# Patient Record
Sex: Male | Born: 1975
Health system: Southern US, Community
[De-identification: ages and names within clinical notes are randomized; demographics above are authoritative.]

## PROBLEM LIST (undated history)

## (undated) DIAGNOSIS — I10 Essential (primary) hypertension: Secondary | ICD-10-CM

## (undated) DIAGNOSIS — Z860100 Personal history of colon polyps, unspecified: Secondary | ICD-10-CM

## (undated) DIAGNOSIS — R569 Unspecified convulsions: Secondary | ICD-10-CM

## (undated) DIAGNOSIS — E785 Hyperlipidemia, unspecified: Secondary | ICD-10-CM

## (undated) HISTORY — PX: TONSILLECTOMY: SUR1361

## (undated) HISTORY — DX: Essential (primary) hypertension: I10

## (undated) HISTORY — DX: Hyperlipidemia, unspecified: E78.5

## (undated) HISTORY — PX: COLONOSCOPY: SHX174

---

## 2008-07-31 ENCOUNTER — Emergency Department (HOSPITAL_COMMUNITY): Admission: EM | Admit: 2008-07-31 | Discharge: 2008-07-31 | Payer: Self-pay | Admitting: Emergency Medicine

## 2009-01-19 ENCOUNTER — Emergency Department (HOSPITAL_COMMUNITY): Admission: EM | Admit: 2009-01-19 | Discharge: 2009-01-19 | Payer: Self-pay | Admitting: Emergency Medicine

## 2011-07-18 LAB — POCT I-STAT, CHEM 8
BUN: 27 — ABNORMAL HIGH
Chloride: 105
Creatinine, Ser: 1.1
Glucose, Bld: 144 — ABNORMAL HIGH
HCT: 53 — ABNORMAL HIGH
Potassium: 5.3 — ABNORMAL HIGH

## 2012-10-16 HISTORY — PX: HERNIA REPAIR: SHX51

## 2014-08-14 ENCOUNTER — Telehealth (INDEPENDENT_AMBULATORY_CARE_PROVIDER_SITE_OTHER): Payer: Self-pay | Admitting: General Surgery

## 2014-08-14 NOTE — Telephone Encounter (Signed)
Patient called in explaining that he was seen last week and was prescribed  Lyrica 75MG , 1 (one) Capsule two times daily, #60, 07/29/2014, Ref. x1; however he went to pick up the Rx and it was $350 because his insurance does not cover this medication.  He would like to see if there is another one that Dr. Grandville Silos could prescribe that would be as effective but cheaper in price.  Informed him I would send this message to Dr. Grandville Silos and I would give him a call back once I have recieved a response.  Patient understood and agreed with this.

## 2014-08-14 NOTE — Telephone Encounter (Signed)
Rx called in to pharmacy and patient notified.  

## 2014-08-14 NOTE — Telephone Encounter (Signed)
Please call in neurontin 300mg : once on day one, BID on day two, then TID, #90 one refill

## 2014-09-17 ENCOUNTER — Encounter: Payer: Self-pay | Admitting: *Deleted

## 2014-09-20 NOTE — Progress Notes (Signed)
Patient ID: Lucas Gentry, male   DOB: 1975-11-04, 37 y.o.   MRN: [id number]    38 yo referred by primary Dr [name] or concern CAD. CRF;s elevated lipids and HTN and family history of premature CAD .  Has been having "hot flashes"  Reviewed recent labs and LDL 92 normal LFTs  "All of the men in my family have MI's or strokes in their 81's"  He is a Music therapist.  Sedentary.  Golf A bit.  Has episodes of diaphoresis when sitting.  Wife concerned about risk of CAD.  He has not had ETT before  Two children [name] and [name] 2/5 at [employer].  Knows Dr [name]    ROS: Denies fever, malais, weight loss, blurry vision, decreased visual acuity, cough, sputum, SOB, hemoptysis, pleuritic pain, palpitaitons, heartburn, abdominal pain, melena, lower extremity edema, claudication, or rash.  All other systems reviewed and negative   General: Affect appropriate Healthy:  appears stated age 52: normal Neck supple with no adenopathy JVP normal no bruits no thyromegaly Lungs clear with no wheezing and good diaphragmatic motion Heart:  S1/S2 no murmur,rub, gallop or click PMI normal Abdomen: benighn, BS positve, no tenderness, no AAA no bruit.  No HSM or HJR Distal pulses intact with no bruits No edema Neuro non-focal Skin warm and dry No muscular weakness  Medications Current Outpatient Prescriptions  Medication Sig Dispense Refill  . aspirin 81 MG tablet Take 81 mg by mouth daily.    Marland Kitchen atenolol (TENORMIN) 25 MG tablet Take 25 mg by mouth daily.    Marland Kitchen atorvastatin (LIPITOR) 10 MG tablet Take 10 mg by mouth daily.    Marland Kitchen lisinopril (PRINIVIL,ZESTRIL) 20 MG tablet Take 20 mg by mouth 2 (two) times daily.    . Multiple Vitamins-Minerals (MULTIVITAL PO) Take 1 tablet by mouth daily.    . Omega-3 Fatty Acids (FISH OIL PO) Take 1 capsule by mouth daily.     No current facility-administered medications for this visit.    Allergies Review of patient's allergies indicates no known  allergies.  Family History: Family History  Problem Relation Age of Onset  . Hypertension Mother   . Hyperlipidemia Mother   . Hyperlipidemia Father   . Hypertension Maternal Grandmother   . Heart attack Maternal Grandfather   . Hyperlipidemia Paternal Grandmother   . Cancer Paternal Grandfather   . Hyperlipidemia Paternal Grandfather   . Stroke Paternal Grandfather     Social History: History   Social History  . Marital Status: Married    Spouse Name: N/A    Number of Children: N/A  . Years of Education: N/A   Occupational History  . Not on file.   Social History Main Topics  . Smoking status: Never Smoker   . Smokeless tobacco: Not on file  . Alcohol Use: Yes     Comment: occasionaly  . Drug Use: No  . Sexual Activity: Not on file   Other Topics Concern  . Not on file   Social History Narrative  . No narrative on file    Past Surgical History  Procedure Laterality Date  . Hernia repair      Past Medical History  Diagnosis Date  . Hypertension   . Hyperlipidemia     Electrocardiogram:  SR rate 59 normal ECG  MD office 10/15  Today SR rate 72  Normal   Assessment and Plan

## 2014-09-21 ENCOUNTER — Encounter: Payer: Self-pay | Admitting: Cardiovascular Disease

## 2014-09-21 ENCOUNTER — Ambulatory Visit (INDEPENDENT_AMBULATORY_CARE_PROVIDER_SITE_OTHER)
Admission: RE | Admit: 2014-09-21 | Discharge: 2014-09-21 | Disposition: A | Discharge status: Home / Self Care | Payer: BC Managed Care – PPO | Source: Ambulatory Visit | Attending: Cardiovascular Disease | Admitting: Cardiovascular Disease

## 2014-09-21 ENCOUNTER — Ambulatory Visit (INDEPENDENT_AMBULATORY_CARE_PROVIDER_SITE_OTHER): Payer: BC Managed Care – PPO | Admitting: Cardiovascular Disease

## 2014-09-21 VITALS — BP 132/82 | HR 72 | Ht 72.0 in | Wt 256.1 lb

## 2014-09-21 DIAGNOSIS — E785 Hyperlipidemia, unspecified: Secondary | ICD-10-CM | POA: Insufficient documentation

## 2014-09-21 DIAGNOSIS — R079 Chest pain, unspecified: Secondary | ICD-10-CM

## 2014-09-21 DIAGNOSIS — Z8249 Family history of ischemic heart disease and other diseases of the circulatory system: Secondary | ICD-10-CM

## 2014-09-21 DIAGNOSIS — I10 Essential (primary) hypertension: Secondary | ICD-10-CM | POA: Insufficient documentation

## 2014-09-21 HISTORY — DX: Family history of ischemic heart disease and other diseases of the circulatory system: Z82.49

## 2014-09-21 NOTE — Assessment & Plan Note (Signed)
Will order ETT  Given normal ECG Also patient wants calcium score to risk stratify  Already on ASA and statin but intensity of Rx may change if score not 0

## 2014-09-21 NOTE — Assessment & Plan Note (Signed)
Cholesterol is at goal.  Continue current dose of statin and diet Rx.  No myalgias or side effects.  F/U  LFT's in 6 months. No results found for: LDLCALC           

## 2014-09-21 NOTE — Patient Instructions (Signed)
Your physician wants you to follow-up in:   [place] will receive a reminder letter in the mail two months in advance. If you don't receive a letter, please call our office to schedule the follow-up appointment. Your physician recommends that you continue on your current medications as directed. Please refer to the Current Medication list given to you today. Your physician has requested that you have an exercise tolerance test. For further information please visit [email or web address]. Please also follow instruction sheet, as given.   CALCIUM SCORE    OUT OF  POCKET  $150.00

## 2014-09-21 NOTE — Addendum Note (Signed)
Addended by: Gust Brooms A on: 09/21/2014 03:52 PM   Modules accepted: Orders

## 2014-09-21 NOTE — Assessment & Plan Note (Signed)
Well controlled.  Continue current medications and low sodium Dash type diet.    

## 2014-09-22 ENCOUNTER — Other Ambulatory Visit: Payer: Self-pay

## 2014-09-22 ENCOUNTER — Telehealth: Payer: Self-pay | Admitting: *Deleted

## 2014-09-22 DIAGNOSIS — R911 Solitary pulmonary nodule: Secondary | ICD-10-CM

## 2014-09-22 NOTE — Telephone Encounter (Signed)
    ADDENDUM REPORT: 09/22/2014 13:52  ADDENDUM: There is 9 mm pulmonary nodule at the left lung base on image 40, series 4. There are small calcified hilar lymph nodes on the right. No pericardial fluid. No mediastinal adenopathy identified. No axillary adenopathy.  Limited view of the upper abdomen is unremarkable.  IMPRESSION: 9 mm nodule at the left lung base. Recommend followup contrast CT of the thorax in 3 months for further evaluation (per Fleischner criteria).  These results will be called to the ordering clinician or representative by the Radiologist Assistant, and communication documented in the PACS or zVision Dashboard.   Electronically Signed  By: Suzy Bouchard M.D.   09/22/14-this report was called to Dr Johnsie Cancel by Vito Backers at [facility] [phone].  Dr Leonia Reeves wanted to make Dr Johnsie Cancel aware there is a 27mm nodule at the left lung base. Recommend followup contrast CT of the thorax in 3 months for further evaluation.  I will forward to Dr Nishan/Christine for review and send In Basket message to Dr Nishan/Christine.

## 2014-09-23 NOTE — Telephone Encounter (Signed)
I called him about his calcium score Radiologist saw lung nodule needs referral to pulmonary and repeat CT With contrast of chest in 3 months  Refer to pulmonary for f/u of lung nodule

## 2014-09-23 NOTE — Addendum Note (Signed)
Addended by: Devra Dopp E on: 09/23/2014 02:52 PM   Modules accepted: Orders

## 2014-09-23 NOTE — Telephone Encounter (Signed)
PT  AWARE  REFERRAL SENT  TO [facility]  TO  MAKE  AN APPT .Lucas Gentry

## 2014-09-24 ENCOUNTER — Telehealth: Payer: Self-pay | Admitting: Cardiovascular Disease

## 2014-09-24 ENCOUNTER — Other Ambulatory Visit: Payer: Self-pay | Admitting: Internal Medicine

## 2014-09-24 DIAGNOSIS — R911 Solitary pulmonary nodule: Secondary | ICD-10-CM

## 2014-09-24 NOTE — Telephone Encounter (Signed)
New Message        Pt calling stating that someone is suppose to be getting in contact with him for a CT. Please call back and advise.

## 2014-09-24 NOTE — Telephone Encounter (Signed)
PULMONARY NEEDS TO ORDER  CHEST  CT   FOR  NODULE  NEEDS  REPEATED  IN   3 MONTHS PER  RADIOLOGIST

## 2014-09-24 NOTE — Telephone Encounter (Signed)
Follow up    Patient at the office now.   Patient is asking for CT order to be fax [facility]  . Fax # [phone]. Can do CT to be done today.  Office [phone].

## 2014-09-25 ENCOUNTER — Other Ambulatory Visit: Payer: BC Managed Care – PPO

## 2014-09-25 ENCOUNTER — Ambulatory Visit (INDEPENDENT_AMBULATORY_CARE_PROVIDER_SITE_OTHER): Payer: BC Managed Care – PPO | Admitting: Internal Medicine

## 2014-09-25 ENCOUNTER — Ambulatory Visit (INDEPENDENT_AMBULATORY_CARE_PROVIDER_SITE_OTHER)
Admission: RE | Admit: 2014-09-25 | Discharge: 2014-09-25 | Disposition: A | Discharge status: Home / Self Care | Payer: BC Managed Care – PPO | Source: Ambulatory Visit | Attending: Internal Medicine | Admitting: Internal Medicine

## 2014-09-25 ENCOUNTER — Encounter: Payer: Self-pay | Admitting: Internal Medicine

## 2014-09-25 ENCOUNTER — Other Ambulatory Visit (INDEPENDENT_AMBULATORY_CARE_PROVIDER_SITE_OTHER): Payer: BC Managed Care – PPO

## 2014-09-25 VITALS — BP 128/90 | HR 118 | Ht 73.0 in | Wt 255.0 lb

## 2014-09-25 DIAGNOSIS — R05 Cough: Secondary | ICD-10-CM

## 2014-09-25 DIAGNOSIS — R059 Cough, unspecified: Secondary | ICD-10-CM

## 2014-09-25 DIAGNOSIS — I10 Essential (primary) hypertension: Secondary | ICD-10-CM

## 2014-09-25 DIAGNOSIS — R911 Solitary pulmonary nodule: Secondary | ICD-10-CM

## 2014-09-25 LAB — SEDIMENTATION RATE: SED RATE: 12 mm/h (ref 0–22)

## 2014-09-25 LAB — TSH: TSH: 0.89 u[IU]/mL (ref 0.35–4.50)

## 2014-09-25 MED ORDER — OLMESARTAN MEDOXOMIL 40 MG PO TABS
40.0000 mg | ORAL_TABLET | Freq: Every day | ORAL | Status: DC
Start: 2014-09-25 — End: 2014-09-25

## 2014-09-25 MED ORDER — OLMESARTAN MEDOXOMIL 40 MG PO TABS: 40.0000 mg | ORAL_TABLET | Freq: Every day | ORAL | Status: DC

## 2014-09-25 NOTE — Progress Notes (Signed)
   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 Pulmonary Consult    Referred by Dr. Johnsie Cancel for eval of pulmonary nodule.  Pt c/o cough since end of Oct 2015- worse when he lies down and non prod.   cough keeps wife up at night / dry but doesn't really bother pt   No obvious other patterns in day to day or daytime variabilty or assoc  cp or chest tightness, subjective wheeze overt sinus or hb symptoms. No unusual exp hx or h/o childhood pna/ asthma or knowledge of premature birth.  Sleeping ok without nocturnal  or early am exacerbation  of respiratory  c/o's or need for noct saba. Also denies any obvious fluctuation of symptoms with weather or environmental changes or other aggravating or alleviating factors except as outlined above   Current Medications, Allergies, Complete Past Medical History, Past Surgical History, Family History, and Social History were reviewed in [employer] record.           Review of Systems  Constitutional: Negative for fever, chills, activity change, appetite change and unexpected weight change.  HENT: Negative for congestion, dental problem, postnasal drip, rhinorrhea, sneezing, sore throat, trouble swallowing and voice change.   Eyes: Negative for visual disturbance.  Respiratory: Positive for cough. Negative for choking and shortness of breath.   Cardiovascular: Negative for chest pain and leg swelling.  Gastrointestinal: Negative for nausea, vomiting and abdominal pain.    Genitourinary: Negative for difficulty urinating.  Musculoskeletal: Negative for arthralgias.  Skin: Negative for rash.  Psychiatric/Behavioral: Negative for behavioral problems and confusion.       Objective:   Physical Exam  Stoic amb wm nad  Wt Readings from Last 3 Encounters:  09/25/14 255 lb (115.667 kg)  09/21/14 256 lb 1.9 oz (116.175 kg)    Vital signs reviewed   HEENT: nl dentition, turbinates, and orophanx. Nl external ear canals without cough reflex   NECK :  without JVD/Nodes/TM/ nl carotid upstrokes bilaterally   LUNGS: no acc muscle use, clear to A and P bilaterally without cough on insp or exp maneuvers   CV:  RRR  no s3 or murmur or increase in P2, no edema   ABD:  soft and nontender with nl excursion in the supine position. No bruits or organomegaly, bowel sounds nl  MS:  warm without deformities, calf tenderness, cyanosis or clubbing  SKIN: warm and dry without lesions    NEURO:  alert, approp, no deficits     CXR PA and Lateral:   09/25/2014 : 1. No acute cardiopulmonary disease. 2. Previously identified pulmonary nodule the left lung base is not well identified by standard chest x-ray.    Assessment & Plan:

## 2014-09-25 NOTE — Patient Instructions (Addendum)
Stop lisinopril - a trial off for 4 weeks is the only way to tell if it's the cause of any of your symptoms  Start benicar 40 mg daily and if blood pressure not well controlled ok to increase tenormin to goal pulse at rest of 60   I agree with a full non contrasted CT in 3 months and follow the   Fleischner society guidelines as rec by radiology.  Please remember to go to the lab and x-ray department downstairs for your tests - we will call you with the results when they are available.

## 2014-09-25 NOTE — Progress Notes (Signed)
Quick Note:  Spoke with pt and notified of results per Dr. Wert. Pt verbalized understanding and denied any questions.  ______ 

## 2014-09-27 DIAGNOSIS — R911 Solitary pulmonary nodule: Secondary | ICD-10-CM | POA: Insufficient documentation

## 2014-09-27 NOTE — Assessment & Plan Note (Signed)
The most common causes of chronic cough in immunocompetent adults include the following: upper airway cough syndrome (UACS), previously referred to as postnasal drip syndrome (PNDS), which is caused by variety of rhinosinus conditions; (2) asthma; (3) GERD; (4) chronic bronchitis from cigarette smoking or other inhaled environmental irritants; (5) nonasthmatic eosinophilic bronchitis; and (6) bronchiectasis.   These conditions, singly or in combination, have accounted for up to 94% of the causes of chronic cough in prospective studies.   Other conditions have constituted no >6% of the causes in prospective studies These have included bronchogenic carcinoma, chronic interstitial pneumonia, sarcoidosis, left ventricular failure, ACEI-induced cough, and aspiration from a condition associated with pharyngeal dysfunction.    Chronic cough is often simultaneously caused by more than one condition. A single cause has been found from 38 to 82% of the time, multiple causes from 18 to 62%. Multiply caused cough has been the result of three diseases up to 42% of the time.       Based on hx and exam, this is most likely:  Classic Upper airway cough syndrome, so named because it's frequently impossible to sort out how much is  CR/sinusitis with freq throat clearing (which can be related to primary GERD)   vs  causing  secondary (" extra esophageal")  GERD from wide swings in gastric pressure that occur with throat clearing, often  promoting self use of mint and menthol lozenges that reduce the lower esophageal sphincter tone and exacerbate the problem further in a cyclical fashion.   These are the same pts (now being labeled as having "irritable larynx syndrome" by some cough centers) who not infrequently have a history of having failed to tolerate ace inhibitors,  dry powder inhalers or biphosphonates or report having atypical reflux symptoms that don't respond to standard doses of PPI , and are easily confused as  having aecopd or asthma flares by even experienced allergists/ pulmonologists.   The first step is to stop ACEi (see hbp) and regroup if the cough persists.  See instructions for specific recommendations which were reviewed directly with the patient who was given a copy with highlighter outlining the key components.

## 2014-09-27 NOTE — Assessment & Plan Note (Addendum)

## 2014-09-27 NOTE — Assessment & Plan Note (Addendum)
Clearly this has nothing to do with with symptoms, all be they very non-specific  See Cardiac CT 09/22/14 9 mm pulmonary nodule at the left lung base > in computer for recall full CT of Chest 12/22/14   CT results reviewed with pt >>> Too small for PET or bx, not suspicious enough for excisional bx > really only option for now is follow the Fleischner society guidelines as rec by radiology = 3 month CT chest

## 2014-09-28 ENCOUNTER — Encounter: Payer: Self-pay | Admitting: Cardiovascular Disease

## 2014-10-07 ENCOUNTER — Telehealth (HOSPITAL_COMMUNITY): Payer: Self-pay

## 2014-10-07 NOTE — Telephone Encounter (Signed)
Encounter complete. 

## 2014-10-08 ENCOUNTER — Ambulatory Visit (HOSPITAL_COMMUNITY)
Admission: RE | Admit: 2014-10-08 | Discharge: 2014-10-08 | Disposition: A | Discharge status: Home / Self Care | Payer: BC Managed Care – PPO | Source: Ambulatory Visit | Attending: Cardiovascular Disease | Admitting: Cardiovascular Disease

## 2014-10-08 DIAGNOSIS — I10 Essential (primary) hypertension: Secondary | ICD-10-CM | POA: Insufficient documentation

## 2014-10-08 DIAGNOSIS — R079 Chest pain, unspecified: Secondary | ICD-10-CM

## 2014-10-08 NOTE — Procedures (Signed)
Exercise Treadmill Test     Test  Exercise Tolerance Test Ordering MD: [name], MD    Unique Test No: 1  Treadmill:  1  Indication for ETT: Strong FH MIs  Contraindication to ETT: No   Stress Modality: exercise - treadmill  Cardiac Imaging Performed: non   Protocol: standard Bruce - maximal  Max BP:  159/69  Max MPHR (bpm):  181 85% MPR (bpm):  154  MPHR obtained (bpm):  190 % MPHR obtained:  104  Reached 85% MPHR (min:sec):  7 Total Exercise Time (min-sec):  9  Workload in METS:  10.4 Borg Scale: 15  Reason ETT Terminated:  Fatigue, Leg Discomfort, exceeded MPHR    ST Segment Analysis At Rest: normal ST segments - no evidence of significant ST depression With Exercise: no evidence of significant ST depression  Other Information Arrhythmia:  No Angina during ETT:  absent (0) Quality of ETT:  diagnostic  ETT Interpretation:  normal - no evidence of ischemia by ST analysis  Comments: The patient had an good exercise tolerance.  There was no chest pain.  There was an appropriate level of dyspnea.  There were no arrhythmias, a normal heart rate response and normal BP response.  There were no ischemic ST T wave changes and a normal heart rate recovery.  The Duke Treadmill Score was 9 with low predicted risk.    Recommendations: No evidence of ischemia.  Further plans per Dr. Johnsie Cancel.

## 2014-12-03 ENCOUNTER — Other Ambulatory Visit: Payer: Self-pay | Admitting: Internal Medicine

## 2014-12-22 ENCOUNTER — Ambulatory Visit
Admission: RE | Admit: 2014-12-22 | Discharge: 2014-12-22 | Disposition: A | Discharge status: Home / Self Care | Payer: BC Managed Care – PPO | Source: Ambulatory Visit | Attending: Internal Medicine | Admitting: Internal Medicine

## 2014-12-22 ENCOUNTER — Telehealth: Payer: Self-pay | Admitting: Internal Medicine

## 2014-12-22 DIAGNOSIS — R911 Solitary pulmonary nodule: Secondary | ICD-10-CM

## 2014-12-22 MED ORDER — IOPAMIDOL (ISOVUE-300) INJECTION 61%
75.0000 mL | Freq: Once | INTRAVENOUS | Status: AC | PRN
  Administered 2014-12-22: 75 mL via INTRAVENOUS

## 2014-12-22 NOTE — Telephone Encounter (Signed)
Spoke with pt . Advised him that since we did not order the CT, we can release his results. Pt will call Dr. Glenna Durand office for his results. Nothing further was needed at this time.

## 2014-12-28 ENCOUNTER — Telehealth: Payer: Self-pay | Admitting: Internal Medicine

## 2014-12-28 NOTE — Telephone Encounter (Signed)
Ct Chest dated 12/22/14 was reviewed by Dr [name] He states that ct shows no changes, needs final scan done in 1 yr, placed in tickle file  Pt needs to come in sooner if needed if having any SOB/cough Spoke with pt and notified of results per Dr. [name]. Pt verbalized understanding and denied any questions.

## 2015-03-25 ENCOUNTER — Other Ambulatory Visit: Payer: Self-pay | Admitting: Internal Medicine

## 2015-03-25 DIAGNOSIS — R911 Solitary pulmonary nodule: Secondary | ICD-10-CM

## 2015-06-18 ENCOUNTER — Other Ambulatory Visit: Payer: Self-pay

## 2015-06-22 ENCOUNTER — Other Ambulatory Visit: Payer: Self-pay

## 2015-06-22 ENCOUNTER — Ambulatory Visit
Admission: RE | Admit: 2015-06-22 | Discharge: 2015-06-22 | Disposition: A | Discharge status: Home / Self Care | Payer: BLUE CROSS/BLUE SHIELD | Source: Ambulatory Visit | Attending: Internal Medicine | Admitting: Internal Medicine

## 2015-06-22 DIAGNOSIS — R911 Solitary pulmonary nodule: Secondary | ICD-10-CM

## 2015-10-19 NOTE — Progress Notes (Signed)
Patient ID: Lucas Gentry, male   DOB: [birth year], 40 y.o.   MRN: [id number]    40 y.o. +referred by primary Dr Lucas Gentry or concern CAD. CRF;s elevated lipids and HTN and family history of premature CAD .  Has been having "hot flashes"  Reviewed recent labs and LDL 92 normal LFTs  "All of the men in my family have MI's or strokes in their 65's"  He is a Music therapist.  Sedentary.  Golf A bit.  Has episodes of diaphoresis when sitting.  Wife concerned about risk of CAD.  He has not had ETT before  Two children Lucas Gentry and Lucas Gentry 2/5 at [employer].  Knows Dr Lucas Gentry   10/08/14  ETT normal with good exercise tolerance  09/21/14  Calcium score 0  However at 27mm LLL lung nodule that is being followed by pulmonary now  ROS: Denies fever, malais, weight loss, blurry vision, decreased visual acuity, cough, sputum, SOB, hemoptysis, pleuritic pain, palpitaitons, heartburn, abdominal pain, melena, lower extremity edema, claudication, or rash.  All other systems reviewed and negative   General: Affect appropriate Healthy:  appears stated age 2: normal Neck supple with no adenopathy JVP normal no bruits no thyromegaly Lungs clear with no wheezing and good diaphragmatic motion Heart:  S1/S2 no murmur,rub, gallop or click PMI normal Abdomen: benighn, BS positve, no tenderness, no AAA no bruit.  No HSM or HJR Distal pulses intact with no bruits No edema Neuro non-focal Skin warm and dry No muscular weakness  Medications Current Outpatient Prescriptions  Medication Sig Dispense Refill  . aspirin 81 MG tablet Take 81 mg by mouth daily.    Marland Kitchen atorvastatin (LIPITOR) 10 MG tablet Take 10 mg by mouth daily.    . Multiple Vitamins-Minerals (MULTIVITAL PO) Take 1 tablet by mouth daily.    Marland Kitchen olmesartan (BENICAR) 40 MG tablet Take 1 tablet (40 mg total) by mouth daily. 7 tablet 0  . Omega-3 Fatty Acids (FISH OIL PO) Take 1 capsule by mouth daily.    . valsartan (DIOVAN) 160 MG tablet Take 160  mg by mouth daily.  0  . VIRTUSSIN A/C 100-10 MG/5ML syrup   0   No current facility-administered medications for this visit.    Allergies Review of patient's allergies indicates no known allergies.  Family History: Family History  Problem Relation Age of Onset  . Hypertension Mother   . Hyperlipidemia Mother   . Hyperlipidemia Father   . Hypertension Maternal Grandmother   . Heart attack Maternal Grandfather   . Hyperlipidemia Paternal Grandmother   . Cancer Paternal Grandmother 16    ? type  . Hyperlipidemia Paternal Grandfather   . Stroke Paternal Grandfather     Social History: Social History   Social History  . Marital Status: Married    Spouse Name: N/A  . Number of Children: N/A  . Years of Education: N/A   Occupational History  . Financial advisor     Social History Main Topics  . Smoking status: Never Smoker   . Smokeless tobacco: Never Used  . Alcohol Use: 0.0 oz/week    0 Standard drinks or equivalent per week     Comment: occasionaly  . Drug Use: No  . Sexual Activity: Not on file   Other Topics Concern  . Not on file   Social History Narrative    Past Surgical History  Procedure Laterality Date  . Hernia repair      Past Medical History  Diagnosis Date  .  Hypertension   . Hyperlipidemia     Electrocardiogram:  SR rate 59 normal ECG  MD office 10/15  09/2014 SR rate 72  Normal  10/20/15  SR rate 75 normal   Assessment and Plan  HTN: Well controlled.  Continue current medications and low sodium Dash type diet.   Elevated Lipids Cholesterol is at goal.  Continue current dose of statin and diet Rx.  No myalgias or side effects.  F/U  LFT's in 6 months. No results found for: [facility]  Labs with primary           Family History of CAD  Calcium score 0 normal ETT normal ECG   Lucas Gentry

## 2015-10-20 ENCOUNTER — Encounter: Payer: Self-pay | Admitting: Cardiovascular Disease

## 2015-10-20 ENCOUNTER — Ambulatory Visit (INDEPENDENT_AMBULATORY_CARE_PROVIDER_SITE_OTHER): Payer: BLUE CROSS/BLUE SHIELD | Admitting: Cardiovascular Disease

## 2015-10-20 VITALS — BP 110/70 | HR 77 | Ht 73.0 in | Wt 264.0 lb

## 2015-10-20 DIAGNOSIS — Z8249 Family history of ischemic heart disease and other diseases of the circulatory system: Secondary | ICD-10-CM | POA: Diagnosis not present

## 2015-10-20 NOTE — Patient Instructions (Addendum)
Medication Instructions:  Your physician recommends that you continue on your current medications as directed. Please refer to the Current Medication list given to you today.  Labwork: NONE  Testing/Procedures: NONE  Follow-Up: Your physician wants you to follow-up in:1 year with Dr. Nishan. You will receive a reminder letter in the mail two months in advance. If you don't receive a letter, please call our office to schedule the follow-up appointment.   If you need a refill on your cardiac medications before your next appointment, please call your pharmacy.    

## 2015-10-21 ENCOUNTER — Other Ambulatory Visit: Payer: Self-pay | Admitting: Internal Medicine

## 2015-10-21 DIAGNOSIS — R2241 Localized swelling, mass and lump, right lower limb: Secondary | ICD-10-CM

## 2015-10-29 ENCOUNTER — Ambulatory Visit
Admission: RE | Admit: 2015-10-29 | Discharge: 2015-10-29 | Disposition: A | Discharge status: Home / Self Care | Payer: BLUE CROSS/BLUE SHIELD | Source: Ambulatory Visit | Attending: Internal Medicine | Admitting: Internal Medicine

## 2015-10-29 DIAGNOSIS — R2241 Localized swelling, mass and lump, right lower limb: Secondary | ICD-10-CM

## 2015-10-29 MED ORDER — GADOBENATE DIMEGLUMINE 529 MG/ML IV SOLN
20.0000 mL | Freq: Once | INTRAVENOUS | Status: AC | PRN
  Administered 2015-10-29: 20 mL via INTRAVENOUS

## 2015-11-24 ENCOUNTER — Ambulatory Visit: Payer: Self-pay | Admitting: General Surgery

## 2015-12-08 ENCOUNTER — Encounter (HOSPITAL_COMMUNITY): Payer: Self-pay

## 2015-12-08 ENCOUNTER — Encounter (HOSPITAL_COMMUNITY)
Admission: RE | Admit: 2015-12-08 | Discharge: 2015-12-08 | Disposition: A | Discharge status: Home / Self Care | Payer: BLUE CROSS/BLUE SHIELD | Source: Ambulatory Visit | Attending: General Surgery | Admitting: General Surgery

## 2015-12-08 DIAGNOSIS — D1723 Benign lipomatous neoplasm of skin and subcutaneous tissue of right leg: Secondary | ICD-10-CM | POA: Diagnosis not present

## 2015-12-08 DIAGNOSIS — I10 Essential (primary) hypertension: Secondary | ICD-10-CM | POA: Diagnosis not present

## 2015-12-08 HISTORY — DX: Unspecified convulsions: R56.9

## 2015-12-08 HISTORY — DX: Personal history of colonic polyps: Z86.010

## 2015-12-08 HISTORY — DX: Personal history of colon polyps, unspecified: Z86.0100

## 2015-12-08 LAB — BASIC METABOLIC PANEL
ANION GAP: 11 (ref 5–15)
BUN: 18 mg/dL (ref 6–20)
CO2: 25 mmol/L (ref 22–32)
Calcium: 9.6 mg/dL (ref 8.9–10.3)
Chloride: 103 mmol/L (ref 101–111)
Creatinine, Ser: 1.12 mg/dL (ref 0.61–1.24)
GFR calc Af Amer: >60 mL/min (ref >60)
GFR calc non Af Amer: >60 mL/min (ref >60)
GLUCOSE: 108 mg/dL — AB (ref 65–99)
POTASSIUM: 4.1 mmol/L (ref 3.5–5.1)
Sodium: 139 mmol/L (ref 135–145)

## 2015-12-08 LAB — CBC
HEMATOCRIT: 45.2 % (ref 39.0–52.0)
HEMOGLOBIN: 14.6 g/dL (ref 13.0–17.0)
MCH: 29.4 pg (ref 26.0–34.0)
MCHC: 32.3 g/dL (ref 30.0–36.0)
MCV: 90.9 fL (ref 78.0–100.0)
Platelets: 234 10*3/uL (ref 150–400)
RBC: 4.97 MIL/uL (ref 4.22–5.81)
RDW: 12.7 % (ref 11.5–15.5)
WBC: 8.2 10*3/uL (ref 4.0–10.5)

## 2015-12-08 MED ORDER — CHLORHEXIDINE GLUCONATE 4 % EX LIQD: 1.0000 "application " | Freq: Once | CUTANEOUS | Status: DC

## 2015-12-08 NOTE — Pre-Procedure Instructions (Signed)
Lucas Gentry  12/08/2015      [id number] - [place], [address] [place] [address] Phone: [phone] Fax: [phone]    Your procedure is scheduled on Fri, Feb 24 @ 9:20 AM  Report to [facility] Admitting at 7:20 AM  Call this number if you have problems the morning of surgery:  [phone]   Remember:  Do not eat food or drink liquids after midnight.  Take these medicines the morning of surgery with A SIP OF WATER Alprazolam(Xanax)             Stop taking your Aspirin,Fish Oil,and any Vitamins or Herbal Medications. No Goody's,BC's,Aleve,Advil,Motrin,or Ibuprofen.    Do not wear jewelry.  Do not wear lotions, powders, or colognes.  You may wear deodorant.  Men may shave face and neck.  Do not bring valuables to the hospital.  [facility] is not responsible for any belongings or valuables.  Contacts, dentures or bridgework may not be worn into surgery.  Leave your suitcase in the car.  After surgery it may be brought to your room.  For patients admitted to the hospital, discharge time will be determined by your treatment team.  Patients discharged the day of surgery will not be allowed to drive home.    Special instructions:  [place] - Preparing for Surgery  Before surgery, you can play an important role.  Because skin is not sterile, your skin needs to be as free of germs as possible.  You can reduce the number of germs on you skin by washing with CHG (chlorahexidine gluconate) soap before surgery.  CHG is an antiseptic cleaner which kills germs and bonds with the skin to continue killing germs even after washing.  Please DO NOT use if you have an allergy to CHG or antibacterial soaps.  If your skin becomes reddened/irritated stop using the CHG and inform your nurse when you arrive at Short Stay.  Do not shave (including legs and underarms) for at least  48 hours prior to the first CHG shower.  You may shave your face.  Please follow these instructions carefully:   1.  Shower with CHG Soap the night before surgery and the                                morning of Surgery.  2.  If you choose to wash your hair, wash your hair first as usual with your       normal shampoo.  3.  After you shampoo, rinse your hair and body thoroughly to remove the                      Shampoo.  4.  Use CHG as you would any other liquid soap.  You can apply chg directly       to the skin and wash gently with scrungie or a clean washcloth.  5.  Apply the CHG Soap to your body ONLY FROM THE NECK DOWN.        Do not use on open wounds or open sores.  Avoid contact with your eyes,       ears, mouth and genitals (private parts).  Wash genitals (private parts)       with your normal soap.  6.  Wash thoroughly,  paying special attention to the area where your surgery        will be performed.  7.  Thoroughly rinse your body with warm water from the neck down.  8.  DO NOT shower/wash with your normal soap after using and rinsing off       the CHG Soap.  9.  Pat yourself dry with a clean towel.            10.  Wear clean pajamas.            11.  Place clean sheets on your bed the night of your first shower and do not        sleep with pets.  Day of Surgery  Do not apply any lotions/deoderants the morning of surgery.  Please wear clean clothes to the hospital/surgery center.    Please read over the following fact sheets that you were given. Pain Booklet, Coughing and Deep Breathing and Surgical Site Infection Prevention

## 2015-12-08 NOTE — Progress Notes (Addendum)
Cardiologist is Dr.Nishan with last visit in epic 10-19-15  Medical Md is Dr.[name] Lysle Rubens  Echo denies  Stress test in epic from 10-08-14  Heart cath denies  EKG in epic from 10-20-15  CXR denies in past yr

## 2015-12-09 MED ORDER — CEFAZOLIN SODIUM-DEXTROSE 2-3 GM-% IV SOLR
2.0000 g | INTRAVENOUS | Status: AC
  Administered 2015-12-10: 2 g via INTRAVENOUS
  Filled 2015-12-09 (×2): qty 50

## 2015-12-10 ENCOUNTER — Ambulatory Visit (HOSPITAL_COMMUNITY): Payer: BLUE CROSS/BLUE SHIELD | Admitting: Critical Care Medicine

## 2015-12-10 ENCOUNTER — Encounter (HOSPITAL_COMMUNITY)
Admission: RE | Disposition: A | Discharge status: Home / Self Care | Payer: Self-pay | Source: Ambulatory Visit | Attending: General Surgery

## 2015-12-10 ENCOUNTER — Ambulatory Visit (HOSPITAL_COMMUNITY)
Admission: RE | Admit: 2015-12-10 | Discharge: 2015-12-10 | Disposition: A | Discharge status: Home / Self Care | Payer: BLUE CROSS/BLUE SHIELD | Source: Ambulatory Visit | Attending: General Surgery | Admitting: General Surgery

## 2015-12-10 ENCOUNTER — Encounter (HOSPITAL_COMMUNITY): Payer: Self-pay | Admitting: Critical Care Medicine

## 2015-12-10 DIAGNOSIS — I10 Essential (primary) hypertension: Secondary | ICD-10-CM | POA: Insufficient documentation

## 2015-12-10 DIAGNOSIS — D1723 Benign lipomatous neoplasm of skin and subcutaneous tissue of right leg: Secondary | ICD-10-CM | POA: Diagnosis not present

## 2015-12-10 HISTORY — PX: LIPOMA EXCISION: SHX5283

## 2015-12-10 SURGERY — EXCISION LIPOMA
Anesthesia: General | Site: Leg Upper | Laterality: Right

## 2015-12-10 MED ORDER — BUPIVACAINE-EPINEPHRINE (PF) 0.25% -1:200000 IJ SOLN
INTRAMUSCULAR | Status: AC
  Filled 2015-12-10: qty 30

## 2015-12-10 MED ORDER — ONDANSETRON HCL 4 MG/2ML IJ SOLN
INTRAMUSCULAR | Status: DC | PRN
  Administered 2015-12-10: 4 mg via INTRAVENOUS

## 2015-12-10 MED ORDER — FENTANYL CITRATE (PF) 100 MCG/2ML IJ SOLN
INTRAMUSCULAR | Status: DC | PRN
  Administered 2015-12-10: 50 ug via INTRAVENOUS
  Administered 2015-12-10: 25 ug via INTRAVENOUS
  Administered 2015-12-10: 50 ug via INTRAVENOUS

## 2015-12-10 MED ORDER — BUPIVACAINE-EPINEPHRINE 0.25% -1:200000 IJ SOLN
INTRAMUSCULAR | Status: DC | PRN
  Administered 2015-12-10: 10 mL

## 2015-12-10 MED ORDER — PROPOFOL 10 MG/ML IV BOLUS
INTRAVENOUS | Status: DC | PRN
  Administered 2015-12-10: 200 mg via INTRAVENOUS

## 2015-12-10 MED ORDER — LIDOCAINE HCL (CARDIAC) 20 MG/ML IV SOLN
INTRAVENOUS | Status: AC
  Filled 2015-12-10: qty 5

## 2015-12-10 MED ORDER — MIDAZOLAM HCL 5 MG/5ML IJ SOLN
INTRAMUSCULAR | Status: DC | PRN
  Administered 2015-12-10: 2 mg via INTRAVENOUS

## 2015-12-10 MED ORDER — PROPOFOL 10 MG/ML IV BOLUS
INTRAVENOUS | Status: AC
  Filled 2015-12-10: qty 20

## 2015-12-10 MED ORDER — FENTANYL CITRATE (PF) 250 MCG/5ML IJ SOLN
INTRAMUSCULAR | Status: AC
  Filled 2015-12-10: qty 5

## 2015-12-10 MED ORDER — ATENOLOL 25 MG PO TABS
25.0000 mg | ORAL_TABLET | Freq: Once | ORAL | Status: AC
  Administered 2015-12-10: 25 mg via ORAL
  Filled 2015-12-10: qty 1

## 2015-12-10 MED ORDER — SUCCINYLCHOLINE CHLORIDE 20 MG/ML IJ SOLN
INTRAMUSCULAR | Status: DC | PRN
  Administered 2015-12-10: 100 mg via INTRAVENOUS

## 2015-12-10 MED ORDER — OXYCODONE HCL 5 MG PO TABS
ORAL_TABLET | ORAL | Status: AC
  Filled 2015-12-10: qty 1

## 2015-12-10 MED ORDER — IPRATROPIUM-ALBUTEROL 20-100 MCG/ACT IN AERS
INHALATION_SPRAY | RESPIRATORY_TRACT | Status: DC | PRN
  Administered 2015-12-10: 2 via RESPIRATORY_TRACT

## 2015-12-10 MED ORDER — OXYCODONE HCL 5 MG PO TABS: 5.0000 mg | ORAL_TABLET | ORAL | Status: DC | PRN

## 2015-12-10 MED ORDER — 0.9 % SODIUM CHLORIDE (POUR BTL) OPTIME
TOPICAL | Status: DC | PRN
  Administered 2015-12-10: 1000 mL

## 2015-12-10 MED ORDER — PROMETHAZINE HCL 25 MG/ML IJ SOLN
6.2500 mg | INTRAMUSCULAR | Status: DC | PRN
Start: 2015-12-10 — End: 2015-12-10

## 2015-12-10 MED ORDER — OXYCODONE HCL 5 MG PO TABS
5.0000 mg | ORAL_TABLET | ORAL | Status: DC | PRN
  Administered 2015-12-10: 5 mg via ORAL

## 2015-12-10 MED ORDER — ONDANSETRON HCL 4 MG/2ML IJ SOLN
INTRAMUSCULAR | Status: AC
  Filled 2015-12-10: qty 2

## 2015-12-10 MED ORDER — HYDROMORPHONE HCL 1 MG/ML IJ SOLN
0.2500 mg | INTRAMUSCULAR | Status: DC | PRN
Start: 2015-12-10 — End: 2015-12-10
  Administered 2015-12-10: 0.5 mg via INTRAVENOUS

## 2015-12-10 MED ORDER — HYDROMORPHONE HCL 1 MG/ML IJ SOLN
INTRAMUSCULAR | Status: AC
  Filled 2015-12-10: qty 1

## 2015-12-10 MED ORDER — LIDOCAINE HCL (CARDIAC) 20 MG/ML IV SOLN
INTRAVENOUS | Status: DC | PRN
  Administered 2015-12-10: 80 mg via INTRAVENOUS

## 2015-12-10 MED ORDER — LACTATED RINGERS IV SOLN
INTRAVENOUS | Status: DC
  Administered 2015-12-10: 08:00:00 via INTRAVENOUS

## 2015-12-10 MED ORDER — MIDAZOLAM HCL 2 MG/2ML IJ SOLN
INTRAMUSCULAR | Status: AC
  Filled 2015-12-10: qty 2

## 2015-12-10 MED ORDER — EPHEDRINE SULFATE 50 MG/ML IJ SOLN
INTRAMUSCULAR | Status: DC | PRN
  Administered 2015-12-10 (×2): 5 mg via INTRAVENOUS

## 2015-12-10 SURGICAL SUPPLY — 44 items
BLADE SURG ROTATE 9660 (MISCELLANEOUS) IMPLANT
CANISTER SUCTION 2500CC (MISCELLANEOUS) ×2 IMPLANT
CHLORAPREP W/TINT 26ML (MISCELLANEOUS) IMPLANT
COVER SURGICAL LIGHT HANDLE (MISCELLANEOUS) ×2 IMPLANT
DECANTER SPIKE VIAL GLASS SM (MISCELLANEOUS) IMPLANT
DRAPE LAPAROSCOPIC ABDOMINAL (DRAPES) IMPLANT
DRAPE LAPAROTOMY T 98X78 PEDS (DRAPES) ×2 IMPLANT
DRAPE UTILITY XL STRL (DRAPES) IMPLANT
ELECT CAUTERY BLADE 6.4 (BLADE) ×2 IMPLANT
ELECT REM PT RETURN 9FT ADLT (ELECTROSURGICAL) ×2
ELECTRODE REM PT RTRN 9FT ADLT (ELECTROSURGICAL) ×1 IMPLANT
GAUZE SPONGE 4X4 12PLY STRL (GAUZE/BANDAGES/DRESSINGS) IMPLANT
GLOVE BIO SURGEON STRL SZ8 (GLOVE) ×2 IMPLANT
GLOVE BIOGEL PI IND STRL 7.0 (GLOVE) ×1 IMPLANT
GLOVE BIOGEL PI IND STRL 7.5 (GLOVE) ×1 IMPLANT
GLOVE BIOGEL PI IND STRL 8 (GLOVE) ×1 IMPLANT
GLOVE BIOGEL PI INDICATOR 7.0 (GLOVE) ×1
GLOVE BIOGEL PI INDICATOR 7.5 (GLOVE) ×1
GLOVE BIOGEL PI INDICATOR 8 (GLOVE) ×1
GLOVE SURG SS PI 6.5 STRL IVOR (GLOVE) ×2 IMPLANT
GLOVE SURG SS PI 7.5 STRL IVOR (GLOVE) ×2 IMPLANT
GOWN STRL REUS W/ TWL LRG LVL3 (GOWN DISPOSABLE) ×2 IMPLANT
GOWN STRL REUS W/ TWL XL LVL3 (GOWN DISPOSABLE) ×1 IMPLANT
GOWN STRL REUS W/TWL LRG LVL3 (GOWN DISPOSABLE) ×2
GOWN STRL REUS W/TWL XL LVL3 (GOWN DISPOSABLE) ×1
KIT BASIN OR (CUSTOM PROCEDURE TRAY) ×2 IMPLANT
KIT ROOM TURNOVER OR (KITS) ×2 IMPLANT
LEGGING LITHOTOMY PAIR STRL (DRAPES) ×2 IMPLANT
LIQUID BAND (GAUZE/BANDAGES/DRESSINGS) ×4 IMPLANT
NEEDLE 22X1 1/2 (OR ONLY) (NEEDLE) ×2 IMPLANT
NS IRRIG 1000ML POUR BTL (IV SOLUTION) ×2 IMPLANT
PACK GENERAL/GYN (CUSTOM PROCEDURE TRAY) ×2 IMPLANT
PAD ARMBOARD 7.5X6 YLW CONV (MISCELLANEOUS) ×2 IMPLANT
SPECIMEN JAR MEDIUM (MISCELLANEOUS) ×2 IMPLANT
SUT MNCRL AB 4-0 PS2 18 (SUTURE) ×2 IMPLANT
SUT SILK 2 0 FS (SUTURE) ×2 IMPLANT
SUT VIC AB 3-0 SH 27 (SUTURE) ×3
SUT VIC AB 3-0 SH 27X BRD (SUTURE) ×3 IMPLANT
SUT VICRYL AB 3 0 TIES (SUTURE) IMPLANT
SYR CONTROL 10ML LL (SYRINGE) ×2 IMPLANT
TOWEL OR 17X24 6PK STRL BLUE (TOWEL DISPOSABLE) IMPLANT
TOWEL OR 17X26 10 PK STRL BLUE (TOWEL DISPOSABLE) ×2 IMPLANT
UNDERPAD 30X30 INCONTINENT (UNDERPADS AND DIAPERS) ×2 IMPLANT
WATER STERILE IRR 1000ML POUR (IV SOLUTION) IMPLANT

## 2015-12-10 NOTE — Op Note (Signed)
12/10/2015  9:50 AM  PATIENT:  Lucas Gentry  40 y.o. male  PRE-OPERATIVE DIAGNOSIS:  RIGHT UPPER THIGH LIPOMA  POST-OPERATIVE DIAGNOSIS:  RIGHT UPPER THIGH LIPOMA  PROCEDURE:  Procedure(s): EXCISION LIPOMA RIGHT UPPER THIGH 12x6cm  SURGEON:  Surgeon(s): Georganna Skeans, MD  ASSISTANTS: none   ANESTHESIA:   general  EBL:  Total I/O In: 700 [I.V.:700] Out: 20 [Blood:20]  BLOOD ADMINISTERED:none  DRAINS: none   SPECIMEN:  Excision  DISPOSITION OF SPECIMEN:  PATHOLOGY  COUNTS:  YES  DICTATION: .Dragon Dictation Findings: Fatty mass  Procedure in detail: Lucas Gentry presents for excision of lipoma right upper inner thigh. Informed consent was obtained. His site was marked. He received intravenous and buttocks. He was brought to the operating room and general anesthesia was administered by the anesthesia staff. This was initially via laryngeal mask airway. It was later changed to endotracheal tube. He was placed in lithotomy position. Right inner thigh was prepped and draped in a sterile fashion. Time out procedure was performed. Local anesthetic was injected. Elliptical incision was made to remove a portion of the skin over this large mass. Subcutaneous tissues were dissected down around this vaguely bordered fatty mass. It was excised in its entirety. Cautery and a Vicryl suture were used to good hemostasis. The mass was marked with suture for pathology and sent. Wound was copiously irrigated. Hemostasis was ensured. Subcutaneous tissues were approximated with 3-0 Vicryl. The skin was closed with running 4-0 Monocryl followed by liquid band. All counts were correct. He tolerated procedure well without apparent complications and was taken recovery in stable condition. PATIENT DISPOSITION:  PACU - hemodynamically stable.   Delay start of Pharmacological VTE agent (>24hrs) due to surgical blood loss or risk of bleeding:  no  Georganna Skeans, MD, MPH, FACS Pager: [phone]   2/24/20179:50 AM

## 2015-12-10 NOTE — Anesthesia Procedure Notes (Addendum)
Procedure Name: LMA Insertion Date/Time: 12/10/2015 8:59 AM Performed by: Merrilyn Puma B Pre-anesthesia Checklist: Patient identified, Emergency Drugs available, Timeout performed, Suction available and Patient being monitored Patient Re-evaluated:Patient Re-evaluated prior to inductionOxygen Delivery Method: Circle system utilized Preoxygenation: Pre-oxygenation with 100% oxygen Intubation Type: IV induction Ventilation: Mask ventilation without difficulty LMA: LMA inserted LMA Size: 5.0 Number of attempts: 1 Placement Confirmation: positive ETCO2 and breath sounds checked- equal and bilateral Tube secured with: Tape Dental Injury: Teeth and Oropharynx as per pre-operative assessment    Procedure Name: Intubation Date/Time: 12/10/2015 9:24 AM Performed by: Merrilyn Puma B Pre-anesthesia Checklist: Patient identified, Emergency Drugs available, Timeout performed, Suction available and Patient being monitored Patient Re-evaluated:Patient Re-evaluated prior to inductionOxygen Delivery Method: Circle system utilized Preoxygenation: Pre-oxygenation with 100% oxygen Intubation Type: IV induction Laryngoscope Size: Mac and 4 Grade View: Grade I Tube type: Oral Tube size: 7.5 mm Number of attempts: 1 Airway Equipment and Method: Stylet Placement Confirmation: CO2 detector,  positive ETCO2,  ETT inserted through vocal cords under direct vision and breath sounds checked- equal and bilateral Secured at: 23 cm Tube secured with: Tape Dental Injury: Teeth and Oropharynx as per pre-operative assessment

## 2015-12-10 NOTE — H&P (Signed)
  History of Present Illness Lucas Neri E. Grandville Silos MD; 11/24/2015 9:25 AM) The patient is a 40 year old male who presents with a complaint of Mass. Xaver noticed a mass on his upper inner right thigh last summer. At his become more irritating over time. It tends to rub and bother him when he is playing golf and walking for extended periods of time. Dr. Milinda Hirschfeld asked me to see him in consultation for consideration of removal. He underwent an MR recently which did not clearly visualize this region, but did rule out any sarcoma or other malignant tumor.   Allergies Elbert Ewings, CMA; 11/24/2015 9:14 AM) No Known Drug Allergies10/14/2015  Medication History Elbert Ewings, CMA; 11/24/2015 9:15 AM) Atenolol (25MG  Tablet, Oral) Active. Atorvastatin Calcium (10MG  Tablet, Oral) Active. Valsartan (160MG  Tablet, Oral) Active. Medications Reconciled  Vitals Elbert Ewings CMA; 11/24/2015 9:15 AM) 11/24/2015 9:15 AM Height: 72in Temp.: 98.58F(Temporal)  Pulse: 91 (Regular)  BP: 130/72 (Sitting, Left Arm, Standard)       Physical Exam Lucas Neri E. Grandville Silos MD; 11/24/2015 9:26 AM) General Mental Status-Alert. General Appearance-Consistent with stated age. Hydration-Well hydrated. Voice-Normal.  Head and Neck Head-normocephalic, atraumatic with no lesions or palpable masses. Trachea-midline. Thyroid Gland Characteristics - normal size and consistency.  Eye Eyeball - Bilateral-Extraocular movements intact. Sclera/Conjunctiva - Bilateral-No scleral icterus.  Chest and Lung Exam Chest and lung exam reveals -quiet, even and easy respiratory effort with no use of accessory muscles and on auscultation, normal breath sounds, no adventitious sounds and normal vocal resonance. Inspection Chest Wall - Normal. Back - normal.  Cardiovascular Cardiovascular examination reveals -normal heart sounds, regular rate and rhythm with no murmurs and normal pedal pulses  bilaterally.  Abdomen Inspection Inspection of the abdomen reveals - No Hernias. Palpation/Percussion Palpation and Percussion of the abdomen reveal - Soft, Non Tender, No Rebound tenderness, No Rigidity (guarding) and No hepatosplenomegaly. Auscultation Auscultation of the abdomen reveals - Bowel sounds normal.  Neurologic Neurologic evaluation reveals -alert and oriented x 3 with no impairment of recent or remote memory. Mental Status-Normal.  Musculoskeletal Global Assessment Right Lower Extremity - Note: Right upper medial thigh with 8 cm soft subcutaneous mass, ill-defined, not attached underlying musculature, no significant tenderness, no overlying skin changes. Normal Exam - Left-Upper Extremity Strength Normal and Lower Extremity Strength Normal. Normal Exam - Right-Upper Extremity Strength Normal and Lower Extremity Strength Normal.  Lymphatic Head & Neck  General Head & Neck Lymphatics: Bilateral - Description - Normal. Axillary  General Axillary Region: Bilateral - Description - Normal. Tenderness - Non Tender. Femoral & Inguinal  Generalized Femoral & Inguinal Lymphatics: Bilateral - Description - No Generalized lymphadenopathy.    Assessment & Plan Lucas Neri E. Grandville Silos MD; 11/24/2015 9:27 AM) LIPOMA OF RIGHT THIGH [id number]) Impression: This is symptomatic. I have offered excision as an outpatient surgical procedure. Procedure, risks, and benefits were discussed in detail with him. He is agreeable.  Georganna Skeans, MD, MPH, FACS Trauma: [phone] General Surgery: [phone]

## 2015-12-10 NOTE — Interval H&P Note (Signed)
History and Physical Interval Note:  12/10/2015 8:30 AM  Lucas Gentry  has presented today for surgery, with the diagnosis of lipoma right uppe thigh  The various methods of treatment have been discussed with the patient and family. After consideration of risks, benefits and other options for treatment, the patient has consented to  Procedure(s): [place] (Right) as a surgical intervention .  The patient's history has been reviewed, patient re-examined, site marked, no change in status, stable for surgery.  I have reviewed the patient's chart and labs.  Questions were answered to the patient's satisfaction.     Elvis Boot E

## 2015-12-10 NOTE — Anesthesia Postprocedure Evaluation (Signed)
Anesthesia Post Note  Patient: Lucas Gentry  Procedure(s) Performed: Procedure(s) (LRB): EXCISION LIPOMA RIGHT UPPER THIGH (Right)  Patient location during evaluation: PACU Anesthesia Type: General Level of consciousness: sedated Pain management: pain level controlled Vital Signs Assessment: post-procedure vital signs reviewed and stable Respiratory status: spontaneous breathing and respiratory function stable Cardiovascular status: stable Anesthetic complications: no    Last Vitals:  Filed Vitals:   12/10/15 1045 12/10/15 1100  BP: 142/82   Pulse: 94 90  Temp: 37.1 C   Resp: 15 13    Last Pain:  Filed Vitals:   12/10/15 1110  PainSc: 6                  Toren Tucholski DANIEL

## 2015-12-10 NOTE — Transfer of Care (Signed)
Immediate Anesthesia Transfer of Care Note  Patient: Lucas Gentry  Procedure(s) Performed: Procedure(s): EXCISION LIPOMA RIGHT UPPER THIGH (Right)  Patient Location: PACU  Anesthesia Type:General  Level of Consciousness: awake, alert  and oriented  Airway & Oxygen Therapy: Patient Spontanous Breathing and Patient connected to nasal cannula oxygen  Post-op Assessment: Report given to RN, Post -op Vital signs reviewed and stable and Patient moving all extremities X 4  Post vital signs: Reviewed and stable  Last Vitals:  Filed Vitals:   12/10/15 0753  BP: 145/86  Pulse: 73  Temp: 36.4 C  Resp: 18    Complications: No apparent anesthesia complications

## 2015-12-10 NOTE — Anesthesia Preprocedure Evaluation (Addendum)
Anesthesia Evaluation  Patient identified by MRN, date of birth, ID band Patient awake    Reviewed: Allergy & Precautions, NPO status , Patient's Chart, lab work & pertinent test results  Airway Mallampati: I  TM Distance: >3 FB Neck ROM: Full    Dental  (+) Dental Advisory Given, Teeth Intact   Pulmonary neg pulmonary ROS,    Pulmonary exam normal        Cardiovascular hypertension, Pt. on medications and Pt. on home beta blockers Normal cardiovascular exam     Neuro/Psych Seizures -,  Seizure as a child negative psych ROS   GI/Hepatic negative GI ROS, Neg liver ROS,   Endo/Other  negative endocrine ROS  Renal/GU negative Renal ROS     Musculoskeletal   Abdominal   Peds  Hematology   Anesthesia Other Findings   Reproductive/Obstetrics                           Anesthesia Physical Anesthesia Plan  ASA: II  Anesthesia Plan: General   Post-op Pain Management:    Induction: Intravenous  Airway Management Planned: LMA  Additional Equipment:   Intra-op Plan:   Post-operative Plan: Extubation in OR  Informed Consent: I have reviewed the patients History and Physical, chart, labs and discussed the procedure including the risks, benefits and alternatives for the proposed anesthesia with the patient or authorized representative who has indicated his/her understanding and acceptance.   Dental advisory given  Plan Discussed with: Anesthesiologist, Surgeon and CRNA  Anesthesia Plan Comments:        Anesthesia Quick Evaluation

## 2015-12-13 ENCOUNTER — Encounter (HOSPITAL_COMMUNITY): Payer: Self-pay | Admitting: General Surgery

## 2015-12-15 HISTORY — PX: OTHER SURGICAL HISTORY: SHX169

## 2015-12-30 ENCOUNTER — Encounter (HOSPITAL_COMMUNITY): Payer: Self-pay | Admitting: Nurse Practitioner

## 2015-12-30 ENCOUNTER — Emergency Department (HOSPITAL_COMMUNITY): Payer: BLUE CROSS/BLUE SHIELD

## 2015-12-30 ENCOUNTER — Observation Stay (HOSPITAL_COMMUNITY): Payer: BLUE CROSS/BLUE SHIELD

## 2015-12-30 ENCOUNTER — Observation Stay (HOSPITAL_COMMUNITY)
Admission: EM | Admit: 2015-12-30 | Discharge: 2015-12-31 | Disposition: A | Discharge status: Home / Self Care | Payer: BLUE CROSS/BLUE SHIELD | Attending: Internal Medicine | Admitting: Internal Medicine

## 2015-12-30 ENCOUNTER — Telehealth: Payer: Self-pay | Admitting: Podiatry

## 2015-12-30 DIAGNOSIS — R262 Difficulty in walking, not elsewhere classified: Secondary | ICD-10-CM | POA: Insufficient documentation

## 2015-12-30 DIAGNOSIS — R42 Dizziness and giddiness: Secondary | ICD-10-CM | POA: Insufficient documentation

## 2015-12-30 DIAGNOSIS — R55 Syncope and collapse: Principal | ICD-10-CM | POA: Diagnosis present

## 2015-12-30 DIAGNOSIS — R911 Solitary pulmonary nodule: Secondary | ICD-10-CM | POA: Diagnosis present

## 2015-12-30 DIAGNOSIS — E785 Hyperlipidemia, unspecified: Secondary | ICD-10-CM | POA: Diagnosis not present

## 2015-12-30 DIAGNOSIS — S0003XA Contusion of scalp, initial encounter: Secondary | ICD-10-CM | POA: Insufficient documentation

## 2015-12-30 DIAGNOSIS — Z7982 Long term (current) use of aspirin: Secondary | ICD-10-CM | POA: Diagnosis not present

## 2015-12-30 DIAGNOSIS — I10 Essential (primary) hypertension: Secondary | ICD-10-CM | POA: Diagnosis not present

## 2015-12-30 DIAGNOSIS — F419 Anxiety disorder, unspecified: Secondary | ICD-10-CM | POA: Diagnosis not present

## 2015-12-30 DIAGNOSIS — W19XXXA Unspecified fall, initial encounter: Secondary | ICD-10-CM | POA: Insufficient documentation

## 2015-12-30 DIAGNOSIS — D72829 Elevated white blood cell count, unspecified: Secondary | ICD-10-CM | POA: Diagnosis not present

## 2015-12-30 LAB — CBC WITH DIFFERENTIAL/PLATELET
BASOS PCT: 0 %
Basophils Absolute: 0 10*3/uL (ref 0.0–0.1)
Eosinophils Absolute: 0 10*3/uL (ref 0.0–0.7)
Eosinophils Relative: 0 %
HEMATOCRIT: 45.2 % (ref 39.0–52.0)
HEMOGLOBIN: 15.1 g/dL (ref 13.0–17.0)
LYMPHS PCT: 13 %
Lymphs Abs: 1.3 10*3/uL (ref 0.7–4.0)
MCH: 30.3 pg (ref 26.0–34.0)
MCHC: 33.4 g/dL (ref 30.0–36.0)
MCV: 90.6 fL (ref 78.0–100.0)
MONOS PCT: 6 %
Monocytes Absolute: 0.7 10*3/uL (ref 0.1–1.0)
NEUTROS ABS: 8.6 10*3/uL — AB (ref 1.7–7.7)
NEUTROS PCT: 81 %
Platelets: 265 10*3/uL (ref 150–400)
RBC: 4.99 MIL/uL (ref 4.22–5.81)
RDW: 12.9 % (ref 11.5–15.5)
WBC: 10.7 10*3/uL — ABNORMAL HIGH (ref 4.0–10.5)

## 2015-12-30 LAB — COMPREHENSIVE METABOLIC PANEL
ALBUMIN: 4.5 g/dL (ref 3.5–5.0)
ALK PHOS: 71 U/L (ref 38–126)
ALT: 35 U/L (ref 17–63)
ANION GAP: 15 (ref 5–15)
AST: 27 U/L (ref 15–41)
BILIRUBIN TOTAL: 0.9 mg/dL (ref 0.3–1.2)
BUN: 16 mg/dL (ref 6–20)
CALCIUM: 10.2 mg/dL (ref 8.9–10.3)
CO2: 23 mmol/L (ref 22–32)
Chloride: 104 mmol/L (ref 101–111)
Creatinine, Ser: 1.09 mg/dL (ref 0.61–1.24)
GFR calc Af Amer: >60 mL/min (ref >60)
GLUCOSE: 133 mg/dL — AB (ref 65–99)
Potassium: 4.1 mmol/L (ref 3.5–5.1)
Sodium: 142 mmol/L (ref 135–145)
TOTAL PROTEIN: 7 g/dL (ref 6.5–8.1)

## 2015-12-30 LAB — TROPONIN I

## 2015-12-30 LAB — URINALYSIS, ROUTINE W REFLEX MICROSCOPIC
Bilirubin Urine: NEGATIVE
GLUCOSE, UA: NEGATIVE mg/dL
HGB URINE DIPSTICK: NEGATIVE
Ketones, ur: 40 mg/dL — AB
LEUKOCYTES UA: NEGATIVE
Nitrite: NEGATIVE
PH: 5 (ref 5.0–8.0)
PROTEIN: NEGATIVE mg/dL
SPECIFIC GRAVITY, URINE: 1.023 (ref 1.005–1.030)

## 2015-12-30 LAB — I-STAT TROPONIN, ED: Troponin i, poc: 0 ng/mL (ref 0.00–0.08)

## 2015-12-30 LAB — CBG MONITORING, ED: Glucose-Capillary: 112 mg/dL — ABNORMAL HIGH (ref 65–99)

## 2015-12-30 MED ORDER — ONDANSETRON HCL 4 MG/2ML IJ SOLN: 4.0000 mg | Freq: Four times a day (QID) | INTRAMUSCULAR | Status: DC | PRN

## 2015-12-30 MED ORDER — SODIUM CHLORIDE 0.9% FLUSH
3.0000 mL | Freq: Two times a day (BID) | INTRAVENOUS | Status: DC
  Administered 2015-12-30: 3 mL via INTRAVENOUS

## 2015-12-30 MED ORDER — SODIUM CHLORIDE 0.9 % IV BOLUS (SEPSIS)
1000.0000 mL | Freq: Once | INTRAVENOUS | Status: AC
  Administered 2015-12-30: 1000 mL via INTRAVENOUS

## 2015-12-30 MED ORDER — IRBESARTAN 150 MG PO TABS: 150.0000 mg | ORAL_TABLET | Freq: Every day | ORAL | Status: DC

## 2015-12-30 MED ORDER — MECLIZINE HCL 25 MG PO TABS
25.0000 mg | ORAL_TABLET | Freq: Two times a day (BID) | ORAL | Status: DC | PRN
Start: 2015-12-30 — End: 2015-12-31

## 2015-12-30 MED ORDER — GADOBENATE DIMEGLUMINE 529 MG/ML IV SOLN
20.0000 mL | Freq: Once | INTRAVENOUS | Status: AC | PRN
  Administered 2015-12-30: 20 mL via INTRAVENOUS

## 2015-12-30 MED ORDER — ACETAMINOPHEN 650 MG RE SUPP: 650.0000 mg | Freq: Four times a day (QID) | RECTAL | Status: DC | PRN

## 2015-12-30 MED ORDER — ACETAMINOPHEN 325 MG PO TABS
650.0000 mg | ORAL_TABLET | Freq: Four times a day (QID) | ORAL | Status: DC | PRN
  Administered 2015-12-30 – 2015-12-31 (×3): 650 mg via ORAL
  Filled 2015-12-30 (×2): qty 2

## 2015-12-30 MED ORDER — SODIUM CHLORIDE 0.9 % IV SOLN
INTRAVENOUS | Status: DC
  Administered 2015-12-30: 15:00:00 via INTRAVENOUS

## 2015-12-30 MED ORDER — ASPIRIN 81 MG PO TABS: 81.0000 mg | ORAL_TABLET | Freq: Every day | ORAL | Status: DC

## 2015-12-30 MED ORDER — ATORVASTATIN CALCIUM 10 MG PO TABS: 10.0000 mg | ORAL_TABLET | Freq: Every day | ORAL | Status: DC

## 2015-12-30 MED ORDER — ASPIRIN EC 81 MG PO TBEC
81.0000 mg | DELAYED_RELEASE_TABLET | Freq: Every day | ORAL | Status: DC
  Administered 2015-12-31: 81 mg via ORAL
  Filled 2015-12-30: qty 1

## 2015-12-30 MED ORDER — ONDANSETRON HCL 4 MG/2ML IJ SOLN
4.0000 mg | Freq: Once | INTRAMUSCULAR | Status: AC
  Administered 2015-12-30: 4 mg via INTRAVENOUS
  Filled 2015-12-30: qty 2

## 2015-12-30 MED ORDER — ONDANSETRON HCL 4 MG PO TABS: 4.0000 mg | ORAL_TABLET | Freq: Four times a day (QID) | ORAL | Status: DC | PRN

## 2015-12-30 MED ORDER — ATENOLOL 25 MG PO TABS
25.0000 mg | ORAL_TABLET | Freq: Every day | ORAL | Status: DC
  Administered 2015-12-30: 25 mg via ORAL
  Filled 2015-12-30 (×2): qty 1

## 2015-12-30 MED ORDER — MORPHINE SULFATE (PF) 4 MG/ML IV SOLN
4.0000 mg | Freq: Once | INTRAVENOUS | Status: AC
  Administered 2015-12-30: 4 mg via INTRAVENOUS
  Filled 2015-12-30: qty 1

## 2015-12-30 MED ORDER — MECLIZINE HCL 25 MG PO TABS
25.0000 mg | ORAL_TABLET | ORAL | Status: AC
  Administered 2015-12-30: 25 mg via ORAL
  Filled 2015-12-30: qty 1

## 2015-12-30 MED ORDER — DIAZEPAM 5 MG/ML IJ SOLN
5.0000 mg | Freq: Once | INTRAMUSCULAR | Status: AC
  Administered 2015-12-30: 5 mg via INTRAVENOUS
  Filled 2015-12-30: qty 2

## 2015-12-30 NOTE — ED Provider Notes (Signed)
CSN: [id number]     Arrival date & time 12/30/15  0902 History   First MD Initiated Contact with Patient 12/30/15 [phone]     Chief Complaint  Patient presents with  . Loss of Consciousness     (Consider location/radiation/quality/duration/timing/severity/associated sxs/prior Treatment) Patient is a 40 y.o. male presenting with syncope. The history is provided by the patient (The patient states that he awoke in the bathroom last night complaining of a headache does remember passing out. He has a headache now nausea vomiting).  Loss of Consciousness Episode history:  Single Most recent episode:  Today Timing:  Rare Progression:  Unchanged Chronicity:  New Context: not blood draw   Associated symptoms: nausea and vomiting   Associated symptoms: no chest pain, no headaches and no seizures     Past Medical History  Diagnosis Date  . Hyperlipidemia     takes Atorvastatin daily  . Hypertension     takes Atenolol and Diovan daily  . Seizures ([place])     hx of-as a child.Once tonsils removed no other seizures  . History of colon polyps     precancerous   Past Surgical History  Procedure Laterality Date  . Tonsillectomy    . Hernia repair Right 2014    inguinal  . Colonoscopy    . Lipoma excision Right 12/10/2015    Procedure: EXCISION LIPOMA RIGHT UPPER THIGH;  Surgeon: Georganna Skeans, MD;  Location: [facility];  Service: General;  Laterality: Right;   Family History  Problem Relation Age of Onset  . Hypertension Mother   . Hyperlipidemia Mother   . Hyperlipidemia Father   . Hypertension Maternal Grandmother   . Heart attack Maternal Grandfather   . Hyperlipidemia Paternal Grandmother   . Cancer Paternal Grandmother 43    ? type  . Hyperlipidemia Paternal Grandfather   . Stroke Paternal Grandfather    Social History  Substance Use Topics  . Smoking status: Never Smoker   . Smokeless tobacco: Never Used     Comment: cigar once a year   . Alcohol Use: 0.0 oz/week    0 Standard  drinks or equivalent per week     Comment: occasionaly    Review of Systems  Constitutional: Negative for appetite change and fatigue.  HENT: Negative for congestion, ear discharge and sinus pressure.   Eyes: Negative for discharge.  Respiratory: Negative for cough.   Cardiovascular: Positive for syncope. Negative for chest pain.  Gastrointestinal: Positive for nausea and vomiting. Negative for abdominal pain and diarrhea.  Genitourinary: Negative for frequency and hematuria.  Musculoskeletal: Negative for back pain.  Skin: Negative for rash.  Neurological: Negative for seizures and headaches.  Psychiatric/Behavioral: Negative for hallucinations.      Allergies  Adhesive  Home Medications   Prior to Admission medications   Medication Sig Start Date End Date Taking? Authorizing Provider  ALPRAZolam Duanne Moron) 0.5 MG tablet Take 0.5 mg by mouth daily as needed for anxiety (flying).   Yes Historical Provider, MD  aspirin 81 MG tablet Take 81 mg by mouth daily.   Yes Historical Provider, MD  aspirin 81 MG tablet Take 81 mg by mouth daily.   Yes Historical Provider, MD  atenolol (TENORMIN) 25 MG tablet Take 25 mg by mouth at bedtime. 11/19/15  Yes Historical Provider, MD  atorvastatin (LIPITOR) 10 MG tablet Take 10 mg by mouth daily.   Yes Historical Provider, MD  Multiple Vitamins-Minerals (MULTIVITAL PO) Take 1 tablet by mouth daily.   Yes Historical Provider,  MD  Omega-3 Fatty Acids (FISH OIL PO) Take 1 capsule by mouth daily.   Yes Historical Provider, MD  valsartan (DIOVAN) 160 MG tablet Take 160 mg by mouth daily. 08/24/15  Yes Historical Provider, MD  oxyCODONE (OXY IR/ROXICODONE) 5 MG immediate release tablet Take 1-2 tablets (5-10 mg total) by mouth every 4 (four) hours as needed for moderate pain or severe pain. Patient not taking: Reported on 12/30/2015 12/10/15   Georganna Skeans, MD   BP 126/78 mmHg  Pulse 78  Temp(Src) 97.7 F (36.5 C) (Oral)  Resp 18  Ht 6' 0.5" (1.842 m)   Wt 232 lb (105.235 kg)  BMI 31.02 kg/m2  SpO2 97% Physical Exam  Constitutional: He is oriented to person, place, and time. He appears well-developed.  HENT:  Head: Normocephalic.  Tender occipital head  Eyes: Conjunctivae and EOM are normal. No scleral icterus.  Neck: Neck supple. No thyromegaly present.  Cardiovascular: Normal rate and regular rhythm.  Exam reveals no gallop and no friction rub.   No murmur heard. Pulmonary/Chest: No stridor. He has no wheezes. He has no rales. He exhibits no tenderness.  Abdominal: He exhibits no distension. There is no tenderness. There is no rebound.  Musculoskeletal: Normal range of motion. He exhibits no edema.  Lymphadenopathy:    He has no cervical adenopathy.  Neurological: He is oriented to person, place, and time. He exhibits normal muscle tone. Coordination normal.  Skin: No rash noted. No erythema.  Psychiatric: He has a normal mood and affect. His behavior is normal.    ED Course  Procedures (including critical care time) Labs Review Labs Reviewed  URINALYSIS, ROUTINE W REFLEX MICROSCOPIC (NOT AT [facility]) - Abnormal; Notable for the following:    Ketones, ur 40 (*)    All other components within normal limits  CBC WITH DIFFERENTIAL/PLATELET - Abnormal; Notable for the following:    WBC 10.7 (*)    Neutro Abs 8.6 (*)    All other components within normal limits  COMPREHENSIVE METABOLIC PANEL - Abnormal; Notable for the following:    Glucose, Bld 133 (*)    All other components within normal limits  CBG MONITORING, ED - Abnormal; Notable for the following:    Glucose-Capillary 112 (*)    All other components within normal limits  I-STAT TROPOININ, ED    Imaging Review Dg Chest 2 View  12/30/2015  CLINICAL DATA:  Syncope, struck head on counter, weakness, hypertension, history of seizures EXAM: CHEST  2 VIEW COMPARISON:  09/25/2014 FINDINGS: Normal heart size, mediastinal contours, and pulmonary vascularity. Minimal bibasilar  atelectasis. Lungs otherwise clear. No pleural effusion or pneumothorax. Bones unremarkable. IMPRESSION: Minimal bibasilar atelectasis. Electronically Signed   By: Lavonia Dana M.D.   On: 12/30/2015 10:14   Ct Head Wo Contrast  12/30/2015  CLINICAL DATA:  Fall.  Head injury EXAM: CT HEAD WITHOUT CONTRAST CT CERVICAL SPINE WITHOUT CONTRAST TECHNIQUE: Multidetector CT imaging of the head and cervical spine was performed following the standard protocol without intravenous contrast. Multiplanar CT image reconstructions of the cervical spine were also generated. COMPARISON:  None. FINDINGS: CT HEAD FINDINGS Ventricle size normal.  Cerebral volume normal. Negative for intracranial hemorrhage.  Negative for infarct or mass. Left parietal scalp hematoma.  Negative for skull fracture. CT CERVICAL SPINE FINDINGS Straightening of the cervical lordosis with mild kyphosis. Disc degeneration and spurring at C3-4, C4-5, C5-6 is mild. No significant facet degeneration. Negative for cervical spine fracture. IMPRESSION: No acute intracranial abnormality.  Left parietal  scalp hematoma Negative for cervical spine fracture. Electronically Signed   By: Franchot Gallo M.D.   On: 12/30/2015 10:28   Ct Cervical Spine Wo Contrast  12/30/2015  CLINICAL DATA:  Fall.  Head injury EXAM: CT HEAD WITHOUT CONTRAST CT CERVICAL SPINE WITHOUT CONTRAST TECHNIQUE: Multidetector CT imaging of the head and cervical spine was performed following the standard protocol without intravenous contrast. Multiplanar CT image reconstructions of the cervical spine were also generated. COMPARISON:  None. FINDINGS: CT HEAD FINDINGS Ventricle size normal.  Cerebral volume normal. Negative for intracranial hemorrhage.  Negative for infarct or mass. Left parietal scalp hematoma.  Negative for skull fracture. CT CERVICAL SPINE FINDINGS Straightening of the cervical lordosis with mild kyphosis. Disc degeneration and spurring at C3-4, C4-5, C5-6 is mild. No significant  facet degeneration. Negative for cervical spine fracture. IMPRESSION: No acute intracranial abnormality.  Left parietal scalp hematoma Negative for cervical spine fracture. Electronically Signed   By: Franchot Gallo M.D.   On: 12/30/2015 10:28   I have personally reviewed and evaluated these images and lab results as part of my medical decision-making.   EKG Interpretation   Date/Time:  Thursday December 30 2015 09:13:40 EDT Ventricular Rate:  98 PR Interval:  152 QRS Duration: 103 QT Interval:  357 QTC Calculation: 456 R Axis:   78 Text Interpretation:  Sinus rhythm Probable left atrial enlargement  Borderline T abnormalities, inferior leads Confirmed by Romaine Neville  MD, Nan Maya  [phone]) on 12/30/2015 12:23:53 PM      MDM   Final diagnoses:  Syncope and collapse    Labs unremarkable including CT scan of the head. Patient continues to have dizziness and vomiting over 4 hours in the emergency department. He will be admitted to observation for syncope and concussion    Milton Ferguson, MD 12/30/15 1312

## 2015-12-30 NOTE — Telephone Encounter (Signed)
Left another message for pt to call to schedule an appt.

## 2015-12-30 NOTE — Telephone Encounter (Signed)
lvm for pt to call to schedule an appt. He would be considered new because his last visit was 5.21.12.

## 2015-12-30 NOTE — ED Notes (Signed)
Pt. Fell in bathroom last night and had syncopal episode, woke up on the floor and noted he was nauseated and had a small episode of vomiting after. Patient has hematoma to posterior head. Endorses feeling dizzy, and unsteady. Denies any other neurological deficits. Notes neck soreness- no pain or tenderness. Patient does not remember if he was using restroom.

## 2015-12-30 NOTE — ED Notes (Signed)
Patient complaining of worsening nausea and severe dizziness after being laid flat in CT. MD made aware. Verbal order for 4mg  more of zofran IV to be given and then 25mg  of PO meclizine afterwards.

## 2015-12-30 NOTE — Telephone Encounter (Signed)
Pt is scheduled to see Dr Paulla Dolly on 3.29.17. Pt req day

## 2015-12-30 NOTE — ED Notes (Signed)
Attempted report 

## 2015-12-30 NOTE — H&P (Signed)
[facility] History and Physical                                                                                    Lucas Gentry, is a 40 y.o. male  MRN: [id number]   DOB - 06/10/1976  Admit Date - 12/30/2015  Outpatient Primary MD for the patient is Wenda Low, MD  Cardiology: Johnsie Cancel  With History of -  Past Medical History  Diagnosis Date  . Hyperlipidemia     takes Atorvastatin daily  . Hypertension     takes Atenolol and Diovan daily  . Seizures ([place])     hx of-as a child.Once tonsils removed no other seizures  . History of colon polyps     precancerous      Past Surgical History  Procedure Laterality Date  . Tonsillectomy    . Hernia repair Right 2014    inguinal  . Colonoscopy    . Lipoma excision Right 12/10/2015    Procedure: EXCISION LIPOMA RIGHT UPPER THIGH;  Surgeon: Georganna Skeans, MD;  Location: [facility];  Service: General;  Laterality: Right;    in for   Chief Complaint  Patient presents with  . Loss of Consciousness     HPI  Lucas Gentry  is a 40 y.o. male with pmh of htn , hyperlipidemia, anxiety and recent right upper thigh lipoma removal on 12/10/15 presents to ED after syncopal event. Pt states he was feeling "fine" prior to going to bed last evening then awoke on the bathroom floor at 1am. Since then pt has had persistent dizziness described as "everything spinning around".  Pt has no recollection of events leading up to syncopal event. He denies any urinary or fecal incontinence. He went back to bed after episode and called primary MD to be seen this am. Pt was sent to ED by PCP for further evaluation. Denies recent fever, headachache, chest pain, sob, abdominal pain or diarrhea.   BP on evaluation at office was 110/60 sitting and 100/60 standing, pulse 80. Pt unable to tolerate orthostatics right now due to dizziness and vomiting. Of note, pt has had recent intentional weight loss of 30lbs. Medications have remained unchanged. Pt denies recent  fever, headachache, chest pain, sob, abdominal pain or diarrhea. Pt is to be admitted for further evaluation and treatment.    Review of Systems   In addition to the HPI above,  No Fever-chills, No Headache, No changes with Vision or hearing, No problems swallowing food or Liquids, No Chest pain, Cough or Shortness of Breath, No Abdominal pain, No Nausea or Vomiting, Bowel movements are regular, No Blood in stool or Urine, No dysuria, No new skin rashes or bruises, No new joints pains-aches,  No new weakness, tingling, numbness in any extremity, No recent weight gain or loss, A full 10 point Review of Systems was done, except as stated above, all other Review of Systems were negative.  Social History Social History  Substance Use Topics  . Smoking status: Never Smoker   . Smokeless tobacco: Never Used     Comment: cigar once a year   . Alcohol Use: 0.0 oz/week    0 Standard drinks  or equivalent per week     Comment: occasionaly  Married. 2 young children. Works in Engineer, mining.   Family History Family History  Problem Relation Age of Onset  . Hypertension Mother   . Hyperlipidemia Mother   . Hyperlipidemia Father   . Hypertension Maternal Grandmother   . Heart attack Maternal Grandfather   . Hyperlipidemia Paternal Grandmother   . Cancer Paternal Grandmother 65    ? type  . Hyperlipidemia Paternal Grandfather   . Stroke Paternal Grandfather     Prior to Admission medications   Medication Sig Start Date End Date Taking? Authorizing Provider  ALPRAZolam Duanne Moron) 0.5 MG tablet Take 0.5 mg by mouth daily as needed for anxiety (flying).   Yes Historical Provider, MD  aspirin 81 MG tablet Take 81 mg by mouth daily.   Yes Historical Provider, MD  aspirin 81 MG tablet Take 81 mg by mouth daily.   Yes Historical Provider, MD  atenolol (TENORMIN) 25 MG tablet Take 25 mg by mouth at bedtime. 11/19/15  Yes Historical Provider, MD  atorvastatin (LIPITOR) 10 MG tablet Take 10 mg by mouth  daily.   Yes Historical Provider, MD  Multiple Vitamins-Minerals (MULTIVITAL PO) Take 1 tablet by mouth daily.   Yes Historical Provider, MD  Omega-3 Fatty Acids (FISH OIL PO) Take 1 capsule by mouth daily.   Yes Historical Provider, MD  valsartan (DIOVAN) 160 MG tablet Take 160 mg by mouth daily. 08/24/15  Yes Historical Provider, MD  oxyCODONE (OXY IR/ROXICODONE) 5 MG immediate release tablet Take 1-2 tablets (5-10 mg total) by mouth every 4 (four) hours as needed for moderate pain or severe pain. Patient not taking: Reported on 12/30/2015 12/10/15   Georganna Skeans, MD    Allergies  Allergen Reactions  . Adhesive [Tape] Rash and Other (See Comments)    39M bandaids glues makes blisters    Physical Exam  Vitals  Blood pressure 126/78, pulse 78, temperature 97.7 F (36.5 C), temperature source Oral, resp. rate 18, height 6' 0.5" (1.842 m), weight 105.235 kg (232 lb), SpO2 97 %.   General:  lying in bed in NAD, Not moving head for fear of dizziness  Psych:  Normal affect and insight, Not Suicidal or Homicidal, Awake Alert, Oriented X 3.  Neuro:   No F.N deficits, ALL C.Nerves Intact, Strength 5/5 all 4 extremities, Sensation intact all 4 extremities.  ENT:  Tympanic membranes without erythema or exudate, good light reflex. Lateral nystagmus  Neck:  Supple, No lymphadenopathy appreciated  Respiratory:  Symmetrical chest wall movement, Good air movement bilaterally, CTAB.  Cardiac:  RRR, No Murmurs, no LE edema noted, no JVD.    Abdomen:  Positive bowel sounds, Soft, Non tender, Non distended,  No masses appreciated  Skin:  L groin incision without evidence of infection  Extremities:  Able to move all 4. 5/5 strength in each,  no effusions.  Data Review  CBC  Recent Labs Lab 12/30/15 0925  WBC 10.7*  HGB 15.1  HCT 45.2  PLT 265  MCV 90.6  MCH 30.3  MCHC 33.4  RDW 12.9  LYMPHSABS 1.3  MONOABS 0.7  EOSABS 0.0  BASOSABS 0.0    Chemistries   Recent Labs Lab  12/30/15 0925  NA 142  K 4.1  CL 104  CO2 23  GLUCOSE 133*  BUN 16  CREATININE 1.09  CALCIUM 10.2  AST 27  ALT 35  ALKPHOS 71  BILITOT 0.9    estimated creatinine clearance is 113.9 mL/min (by  C-G formula based on Cr of 1.09).  No results for input(s): TSH, T4TOTAL, T3FREE, THYROIDAB in the last 72 hours.  Invalid input(s): FREET3  Coagulation profile No results for input(s): INR, PROTIME in the last 168 hours.  No results for input(s): DDIMER in the last 72 hours.  Cardiac Enzymes No results for input(s): CKMB, TROPONINI, MYOGLOBIN in the last 168 hours.  Invalid input(s): CK  Invalid input(s): POCBNP  Urinalysis    Component Value Date/Time   COLORURINE YELLOW 12/30/2015 [place] 12/30/2015 1146   LABSPEC 1.023 12/30/2015 1146   PHURINE 5.0 12/30/2015 1146   GLUCOSEU NEGATIVE 12/30/2015 1146   [place] 12/30/2015 [place] 12/30/2015 1146   KETONESUR 40* 12/30/2015 1146   PROTEINUR NEGATIVE 12/30/2015 1146   NITRITE NEGATIVE 12/30/2015 1146   LEUKOCYTESUR NEGATIVE 12/30/2015 1146    Imaging results:   Dg Chest 2 View  12/30/2015  CLINICAL DATA:  Syncope, struck head on counter, weakness, hypertension, history of seizures EXAM: CHEST  2 VIEW COMPARISON:  09/25/2014 FINDINGS: Normal heart size, mediastinal contours, and pulmonary vascularity. Minimal bibasilar atelectasis. Lungs otherwise clear. No pleural effusion or pneumothorax. Bones unremarkable. IMPRESSION: Minimal bibasilar atelectasis. Electronically Signed   By: Lavonia Dana M.D.   On: 12/30/2015 10:14   Ct Head Wo Contrast  12/30/2015  CLINICAL DATA:  Fall.  Head injury EXAM: CT HEAD WITHOUT CONTRAST CT CERVICAL SPINE WITHOUT CONTRAST TECHNIQUE: Multidetector CT imaging of the head and cervical spine was performed following the standard protocol without intravenous contrast. Multiplanar CT image reconstructions of the cervical spine were also generated.  COMPARISON:  None. FINDINGS: CT HEAD FINDINGS Ventricle size normal.  Cerebral volume normal. Negative for intracranial hemorrhage.  Negative for infarct or mass. Left parietal scalp hematoma.  Negative for skull fracture. CT CERVICAL SPINE FINDINGS Straightening of the cervical lordosis with mild kyphosis. Disc degeneration and spurring at C3-4, C4-5, C5-6 is mild. No significant facet degeneration. Negative for cervical spine fracture. IMPRESSION: No acute intracranial abnormality.  Left parietal scalp hematoma Negative for cervical spine fracture. Electronically Signed   By: Franchot Gallo M.D.   On: 12/30/2015 10:28   Ct Cervical Spine Wo Contrast  12/30/2015  CLINICAL DATA:  Fall.  Head injury EXAM: CT HEAD WITHOUT CONTRAST CT CERVICAL SPINE WITHOUT CONTRAST TECHNIQUE: Multidetector CT imaging of the head and cervical spine was performed following the standard protocol without intravenous contrast. Multiplanar CT image reconstructions of the cervical spine were also generated. COMPARISON:  None. FINDINGS: CT HEAD FINDINGS Ventricle size normal.  Cerebral volume normal. Negative for intracranial hemorrhage.  Negative for infarct or mass. Left parietal scalp hematoma.  Negative for skull fracture. CT CERVICAL SPINE FINDINGS Straightening of the cervical lordosis with mild kyphosis. Disc degeneration and spurring at C3-4, C4-5, C5-6 is mild. No significant facet degeneration. Negative for cervical spine fracture. IMPRESSION: No acute intracranial abnormality.  Left parietal scalp hematoma Negative for cervical spine fracture. Electronically Signed   By: Franchot Gallo M.D.   On: 12/30/2015 10:28    My personal review of EKG: New t wave inversion in inferior leads since 10/2015   Assessment & Plan  Active Problems:   Essential hypertension   Elevated lipids   Solitary pulmonary nodule   Syncope   Syncope -findings c/w BPPV, but given hx will admit to r/o cardiac etiology and stroke. No evidence of  PE as no sob, not hypoxic or tachycardic -MRI brain, cycle cardiac enzymes, 2Decho, monitor tele -  PT eval for BPPV -prn meclizine  Leukocytosis, mild -suspect related to fall as pt afebrile, NT appearing -will check u/a, cxr r/o infx etiology  Hypertension -controlled on admit -continue home ARB and BB for now -monitor need to decrease dose given recent intentional weight loss (30lbs)  Hyperlipidemia -continue statin   Recent lipoma removal (right upper thigh) -no s/sx's infection   DVT Prophylaxis: SCDs  AM Labs Ordered, also please review Full Orders  Family Communication:   Wife at bedside on exam  Code Status:  Full code  Condition:  stable  Time spent in minutes : [place], NP on 12/30/2015 at 1:57 PM Between 7am to 7pm - Pager - [phone] After 7pm go to [email or web address] - password [email or web address]  And look for the night coverage person covering me after hours  [facility]   Patient seen and examined. Agree with above note by Patrici Ranks, NP. Patient comes to the hospital today after waking up on the bathroom floor. He does not recall how he got there. Ever since then though he has had severe vertigo and emesis and he describes it as the room spinning around him. He has obvious horizontal nystagmus on exam. I believe this likely represents peripheral origin, however agree with MRI to completely rule out posterior circulation CVA. Will request vestibular PT evaluation. He is also noted to have some inferior T-wave changes on EKG. Will check 2-D echo, cycle troponins. Has not had any chest pain or shortness of breath.  Domingo Mend, MD [facility] Pager: [phone]

## 2015-12-30 NOTE — ED Notes (Signed)
Admitting MD at bedside.

## 2015-12-30 NOTE — ED Notes (Signed)
Patient stood up to urinate and felt extreme dizziness, lightheadedness and nausea. Patient sat down and vomited 300cc of light brown emesis.

## 2015-12-31 ENCOUNTER — Observation Stay (HOSPITAL_BASED_OUTPATIENT_CLINIC_OR_DEPARTMENT_OTHER): Payer: BLUE CROSS/BLUE SHIELD

## 2015-12-31 DIAGNOSIS — I10 Essential (primary) hypertension: Secondary | ICD-10-CM | POA: Diagnosis not present

## 2015-12-31 DIAGNOSIS — R55 Syncope and collapse: Secondary | ICD-10-CM | POA: Diagnosis not present

## 2015-12-31 HISTORY — PX: TRANSTHORACIC ECHOCARDIOGRAM: SHX275

## 2015-12-31 LAB — BASIC METABOLIC PANEL
ANION GAP: 9 (ref 5–15)
BUN: 12 mg/dL (ref 6–20)
CALCIUM: 9 mg/dL (ref 8.9–10.3)
CO2: 23 mmol/L (ref 22–32)
CREATININE: 1.05 mg/dL (ref 0.61–1.24)
Chloride: 111 mmol/L (ref 101–111)
Glucose, Bld: 138 mg/dL — ABNORMAL HIGH (ref 65–99)
Potassium: 3.7 mmol/L (ref 3.5–5.1)
SODIUM: 143 mmol/L (ref 135–145)

## 2015-12-31 LAB — ECHOCARDIOGRAM COMPLETE
Height: 72.5 in
Weight: 3764.8 oz

## 2015-12-31 LAB — TROPONIN I: Troponin I: <0.03 ng/mL (ref <0.031)

## 2015-12-31 LAB — CBC
HEMATOCRIT: 40.4 % (ref 39.0–52.0)
Hemoglobin: 12.8 g/dL — ABNORMAL LOW (ref 13.0–17.0)
MCH: 29 pg (ref 26.0–34.0)
MCHC: 31.7 g/dL (ref 30.0–36.0)
MCV: 91.4 fL (ref 78.0–100.0)
PLATELETS: 259 10*3/uL (ref 150–400)
RBC: 4.42 MIL/uL (ref 4.22–5.81)
RDW: 13 % (ref 11.5–15.5)
WBC: 8.7 10*3/uL (ref 4.0–10.5)

## 2015-12-31 MED ORDER — OXYCODONE HCL 5 MG PO TABS
5.0000 mg | ORAL_TABLET | Freq: Four times a day (QID) | ORAL | Status: DC | PRN
  Administered 2015-12-31: 5 mg via ORAL
  Filled 2015-12-31 (×2): qty 1

## 2015-12-31 MED ORDER — MECLIZINE HCL 25 MG PO TABS
25.0000 mg | ORAL_TABLET | Freq: Two times a day (BID) | ORAL | Status: DC | PRN
Start: 2015-12-31 — End: 2016-01-05

## 2015-12-31 MED ORDER — OXYCODONE HCL 5 MG PO TABS: 5.0000 mg | ORAL_TABLET | Freq: Four times a day (QID) | ORAL | Status: DC | PRN

## 2015-12-31 NOTE — Progress Notes (Signed)
  Echocardiogram 2D Echocardiogram has been performed.  Jennette Dubin 12/31/2015, 11:50 AM

## 2015-12-31 NOTE — Discharge Summary (Signed)
Physician Discharge Summary  Lucas Gentry [id number] DOB: 03-21-1976 DOA: 12/30/2015  PCP: Wenda Low, MD  Admit date: 12/30/2015 Discharge date: 12/31/2015  Time spent: 35 minutes  Recommendations for Outpatient Follow-up:  Needs referral to cardiology due to change in EKG  Adjust medication for BP as needed.   Discharge Diagnoses:    Syncope   Essential hypertension   Elevated lipids   Solitary pulmonary nodule    Discharge Condition: stable  Diet recommendation: heart healthy   Filed Weights   12/30/15 0905 12/31/15 0651  Weight: 105.235 kg (232 lb) 106.731 kg (235 lb 4.8 oz)    History of present illness:  Lucas Gentry is a 40 y.o. male with pmh of htn , hyperlipidemia, anxiety and recent right upper thigh lipoma removal on 12/10/15 presents to ED after syncopal event. Pt states he was feeling "fine" prior to going to bed last evening then awoke on the bathroom floor at 1am. Since then pt has had persistent dizziness described as "everything spinning around". Pt has no recollection of events leading up to syncopal event. He denies any urinary or fecal incontinence. He went back to bed after episode and called primary MD to be seen this am. Pt was sent to ED by PCP for further evaluation. Denies recent fever, headachache, chest pain, sob, abdominal pain or diarrhea.   BP on evaluation at office was 110/60 sitting and 100/60 standing, pulse 80. Pt unable to tolerate orthostatics right now due to dizziness and vomiting. Of note, pt has had recent intentional weight loss of 30lbs. Medications have remained unchanged. Pt denies recent fever, headachache, chest pain, sob, abdominal pain or diarrhea. Pt is to be admitted for further evaluation and treatment.   Hospital Course:  1-Syncope, dizziness; suspect related to hypotension. Dehydration. Will discontinue diuretics at discharge. ECHO normal Ef. MRI negative for acute stroke. MRI reviewed with neurologist on call, no  significant finding.  Troponin negative. ECHO normal EF. No wall motion abnormalities. No chest pain. T wave change in EKK, will need follow up with cardiology  Patient symptoms resolved.   2-Leukocytosis; resolved.   3-Recent lipoma removal (right upper thigh) -no s/sx's infection 4-Left parietal scalp hematoma; stable.   Procedures:  ECHO  Consultations:  Neurology, phone  Discharge Exam: Filed Vitals:   12/31/15 0651 12/31/15 1205  BP: 93/63 109/69  Pulse: 68 64  Temp: 98.2 F (36.8 C) 98.3 F (36.8 C)  Resp: 20 18    General: NAD Cardiovascular: S 1, S 2 RRR Respiratory: CTA  Discharge Instructions   Discharge Instructions    Diet - low sodium heart healthy    Complete by:  As directed      Increase activity slowly    Complete by:  As directed           Current Discharge Medication List    START taking these medications   Details  meclizine (ANTIVERT) 25 MG tablet Take 1 tablet (25 mg total) by mouth 2 (two) times daily as needed for dizziness. Qty: 30 tablet, Refills: 0      CONTINUE these medications which have CHANGED   Details  oxyCODONE (OXY IR/ROXICODONE) 5 MG immediate release tablet Take 1 tablet (5 mg total) by mouth every 6 (six) hours as needed for severe pain. Qty: 10 tablet, Refills: 0      CONTINUE these medications which have NOT CHANGED   Details  ALPRAZolam (XANAX) 0.5 MG tablet Take 0.5 mg by mouth daily as needed for anxiety (flying).  aspirin 81 MG tablet Take 81 mg by mouth daily.    atenolol (TENORMIN) 25 MG tablet Take 25 mg by mouth at bedtime. Refills: 2    atorvastatin (LIPITOR) 10 MG tablet Take 10 mg by mouth daily.    Multiple Vitamins-Minerals (MULTIVITAL PO) Take 1 tablet by mouth daily.    Omega-3 Fatty Acids (FISH OIL PO) Take 1 capsule by mouth daily.      STOP taking these medications     valsartan (DIOVAN) 160 MG tablet        Allergies  Allergen Reactions  . Adhesive [Tape] Rash and Other (See  Comments)    28M bandaids glues makes blisters   Follow-up Information    Follow up with Wenda Low, MD.   Specialty:  Internal Medicine   Why:  make appointment  with PCP for monday    Contact information:   [address] [place] [address] [phone]        The results of significant diagnostics from this hospitalization (including imaging, microbiology, ancillary and laboratory) are listed below for reference.    Significant Diagnostic Studies: Dg Chest 2 View  12/30/2015  CLINICAL DATA:  Syncope, struck head on counter, weakness, hypertension, history of seizures EXAM: CHEST  2 VIEW COMPARISON:  09/25/2014 FINDINGS: Normal heart size, mediastinal contours, and pulmonary vascularity. Minimal bibasilar atelectasis. Lungs otherwise clear. No pleural effusion or pneumothorax. Bones unremarkable. IMPRESSION: Minimal bibasilar atelectasis. Electronically Signed   By: Lavonia Dana M.D.   On: 12/30/2015 10:14   Ct Head Wo Contrast  12/30/2015  CLINICAL DATA:  Fall.  Head injury EXAM: CT HEAD WITHOUT CONTRAST CT CERVICAL SPINE WITHOUT CONTRAST TECHNIQUE: Multidetector CT imaging of the head and cervical spine was performed following the standard protocol without intravenous contrast. Multiplanar CT image reconstructions of the cervical spine were also generated. COMPARISON:  None. FINDINGS: CT HEAD FINDINGS Ventricle size normal.  Cerebral volume normal. Negative for intracranial hemorrhage.  Negative for infarct or mass. Left parietal scalp hematoma.  Negative for skull fracture. CT CERVICAL SPINE FINDINGS Straightening of the cervical lordosis with mild kyphosis. Disc degeneration and spurring at C3-4, C4-5, C5-6 is mild. No significant facet degeneration. Negative for cervical spine fracture. IMPRESSION: No acute intracranial abnormality.  Left parietal scalp hematoma Negative for cervical spine fracture. Electronically Signed   By: Franchot Gallo M.D.   On: 12/30/2015 10:28    Ct Cervical Spine Wo Contrast  12/30/2015  CLINICAL DATA:  Fall.  Head injury EXAM: CT HEAD WITHOUT CONTRAST CT CERVICAL SPINE WITHOUT CONTRAST TECHNIQUE: Multidetector CT imaging of the head and cervical spine was performed following the standard protocol without intravenous contrast. Multiplanar CT image reconstructions of the cervical spine were also generated. COMPARISON:  None. FINDINGS: CT HEAD FINDINGS Ventricle size normal.  Cerebral volume normal. Negative for intracranial hemorrhage.  Negative for infarct or mass. Left parietal scalp hematoma.  Negative for skull fracture. CT CERVICAL SPINE FINDINGS Straightening of the cervical lordosis with mild kyphosis. Disc degeneration and spurring at C3-4, C4-5, C5-6 is mild. No significant facet degeneration. Negative for cervical spine fracture. IMPRESSION: No acute intracranial abnormality.  Left parietal scalp hematoma Negative for cervical spine fracture. Electronically Signed   By: Franchot Gallo M.D.   On: 12/30/2015 10:28   Mr Jeri Cos [id number] Contrast  12/30/2015  CLINICAL DATA:  Syncopal event. Awoke on bathroom floor at 1 a.m. Struck back of head on floor with persistent dizziness since. Recent right upper thigh lipoma removal  on 12/10/2015. EXAM: MRI HEAD WITHOUT AND WITH CONTRAST TECHNIQUE: Multiplanar, multiecho pulse sequences of the brain and surrounding structures were obtained without and with intravenous contrast. CONTRAST:  90mL MULTIHANCE GADOBENATE DIMEGLUMINE 529 MG/ML IV SOLN COMPARISON:  Head CT 12/30/2015 FINDINGS: A left parieto-occipital scalp hematoma is again noted as seen on CT earlier today. There is a single punctate focus of susceptibility artifact in the right parietal white matter. The brain is otherwise normal in appearance without evidence of acute infarct, mass, midline shift, or extra-axial fluid collection. Ventricles and sulci are normal. A tiny developmental venous anomaly is noted in the posterior left frontal lobe.  Orbits are unremarkable. Paranasal sinuses and mastoid air cells are clear. Major intracranial vascular flow voids are preserved. IMPRESSION: 1. No acute intracranial abnormality or mass. 2. Left parieto-occipital scalp hematoma. 3. Single punctate focus of susceptibility artifact in the right parietal white matter, nonspecific. This may reflect a tiny focus of blood breakdown products (such as from a tiny cavernoma or other chronic microhemorrhage) or focus of mineralization. Regardless, it is of doubtful clinical significance. Electronically Signed   By: Logan Bores M.D.   On: 12/30/2015 20:32    Microbiology: No results found for this or any previous visit (from the past 240 hour(s)).   Labs: Basic Metabolic Panel:  Recent Labs Lab 12/30/15 0925 12/31/15 0330  NA 142 143  K 4.1 3.7  CL 104 111  CO2 23 23  GLUCOSE 133* 138*  BUN 16 12  CREATININE 1.09 1.05  CALCIUM 10.2 9.0   Liver Function Tests:  Recent Labs Lab 12/30/15 0925  AST 27  ALT 35  ALKPHOS 71  BILITOT 0.9  PROT 7.0  ALBUMIN 4.5   No results for input(s): LIPASE, AMYLASE in the last 168 hours. No results for input(s): AMMONIA in the last 168 hours. CBC:  Recent Labs Lab 12/30/15 0925 12/31/15 0330  WBC 10.7* 8.7  NEUTROABS 8.6*  --   HGB 15.1 12.8*  HCT 45.2 40.4  MCV 90.6 91.4  PLT 265 259   Cardiac Enzymes:  Recent Labs Lab 12/30/15 1516 12/30/15 2127 12/31/15 0330  TROPONINI <0.03 <0.03 <0.03   BNP: BNP (last 3 results) No results for input(s): BNP in the last 8760 hours.  ProBNP (last 3 results) No results for input(s): PROBNP in the last 8760 hours.  CBG:  Recent Labs Lab 12/30/15 0934  GLUCAP 112*       Signed:  Niel Hummer A MD.  [facility] 12/31/2015, 1:49 PM

## 2015-12-31 NOTE — Evaluation (Signed)
Physical Therapy Evaluation/Vestibular Assessment & Discharge Patient Details Name: Lucas Gentry MRN: [id number] DOB: 21-Jan-1976 Today's Date: 12/31/2015   History of Present Illness  Lucas Gentry is a 40 y.o. male with pmh of htn , hyperlipidemia, anxiety and recent right upper thigh lipoma removal on 12/10/15 presents to ED after syncopal event.  Reports dizziness started day after syncopal event in which he woke up on bathroom floor with obvious head trauma.  States room spinning occurred yesterday along with severe nausea improved with Valium, and has not had any today.  Clinical Impression  Patient presents without current symptoms of dizziness.  Explained to him possible causes and educated to seek PCP if returns.  Only positive sign was positive right head thrust test, but reports no current vertigo complaints, so likely from cervical/head trauma from fall during syncopal episode.  No further skilled PT needs at this time.    Follow Up Recommendations No PT follow up    Equipment Recommendations  None recommended by PT    Recommendations for Other Services       Precautions / Restrictions Precautions Precautions: None      Mobility  Bed Mobility Overal bed mobility: Independent                Transfers Overall transfer level: Independent                  Ambulation/Gait Ambulation/Gait assistance: Independent Ambulation Distance (Feet): 200 Feet Assistive device: None Gait Pattern/deviations: WFL(Within Functional Limits)        Stairs Stairs: Yes   Stair Management: One rail Right;Alternating pattern;Forwards Number of Stairs: 3    Wheelchair Mobility    Modified Rankin (Stroke Patients Only)       Balance                                 Standardized Balance Assessment Standardized Balance Assessment : Dynamic Gait Index   Dynamic Gait Index Level Surface: Normal Change in Gait Speed: Normal Gait with Horizontal  Head Turns: Normal Gait with Vertical Head Turns: Normal Gait and Pivot Turn: Normal Step Over Obstacle: Normal Step Around Obstacles: Normal Steps: Mild Impairment Total Score: 23       Pertinent Vitals/Pain Pain Assessment: Faces Faces Pain Scale: Hurts even more Pain Location: back of head and neck with pressure and cervical motion Pain Descriptors / Indicators: Sore Pain Intervention(s): Monitored during session    Home Living Family/patient expects to be discharged to:: Private residence Living Arrangements: Spouse/significant other             Home Equipment: None      Prior Function Level of Independence: Independent         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        Extremity/Trunk Assessment   Upper Extremity Assessment: Overall WFL for tasks assessed           Lower Extremity Assessment: Overall WFL for tasks assessed         Communication   Communication: No difficulties  Cognition Arousal/Alertness: Awake/alert Behavior During Therapy: WFL for tasks assessed/performed Overall Cognitive Status: Within Functional Limits for tasks assessed                    Vestibular Assessment  General Comments     12/31/15 0001  Symptom Behavior  Type of Dizziness Spinning  Frequency  of Dizziness intermittent  Duration of Dizziness several minutes on sitting up  Aggravating Factors Supine to sit  Relieving Factors Lying supine  Occulomotor Exam  Occulomotor Alignment Normal  Spontaneous Absent  Gaze-induced Absent  Smooth Pursuits Intact  Saccades Intact  Vestibulo-Occular Reflex  VOR 1 Head Only (x 1 viewing) 30 sec horizontal/vertical no symptoms, good fixation   VOR to Slow Head Movement Positive right  VOR Cancellation Normal  Auditory  Comments intact to scratch test  Positional Testing  Sidelying Test Sidelying Right;Sidelying Left  Sidelying Right  Sidelying Right Duration 30 sec  Sidelying Right  Symptoms No nystagmus  Sidelying Left  Sidelying Left Duration 30 sec  Sidelying Left Symptoms No nystagmus      Exercises        Assessment/Plan    PT Assessment Patent does not need any further PT services  PT Diagnosis Difficulty walking (transient vertigo)   PT Problem List    PT Treatment Interventions     PT Goals (Current goals can be found in the Care Plan section) Acute Rehab PT Goals PT Goal Formulation: All assessment and education complete, DC therapy    Frequency     Barriers to discharge        Co-evaluation               End of Session Equipment Utilized During Treatment: Gait belt Activity Tolerance: Patient tolerated treatment well Patient left: in bed;with call bell/phone within reach;with family/visitor present      Functional Assessment Tool Used: Clincal Judgement Functional Limitation: Mobility: Walking and moving around Mobility: Walking and Moving Around Current Status [id number]): 0 percent impaired, limited or restricted Mobility: Walking and Moving Around Goal Status [id number]): 0 percent impaired, limited or restricted Mobility: Walking and Moving Around Discharge Status [phone]): 0 percent impaired, limited or restricted    Time: 1535-1600 PT Time Calculation (min) (ACUTE ONLY): 25 min   Charges:   PT Evaluation $PT Eval Moderate Complexity: 1 Procedure PT Treatments $Gait Training: 8-22 mins   PT G Codes:   PT G-Codes **NOT FOR INPATIENT CLASS** Functional Assessment Tool Used: Clincal Judgement Functional Limitation: Mobility: Walking and moving around Mobility: Walking and Moving Around Current Status [id number]): 0 percent impaired, limited or restricted Mobility: Walking and Moving Around Goal Status [id number]): 0 percent impaired, limited or restricted Mobility: Walking and Moving Around Discharge Status [id number]): 0 percent impaired, limited or restricted    Reginia Naas 12/31/2015, 5:14 PM  Magda Kiel,  [place] 12/31/2015

## 2015-12-31 NOTE — Progress Notes (Signed)
Pt has orders to be discharged. Discharge instructions given and pt has no additional questions at this time. Medication regimen reviewed and pt educated. Pt verbalized understanding and has no additional questions. Telemetry box removed. IV removed and site in good condition. Pt stable and waiting for transportation. 

## 2016-01-05 ENCOUNTER — Ambulatory Visit (INDEPENDENT_AMBULATORY_CARE_PROVIDER_SITE_OTHER): Payer: BLUE CROSS/BLUE SHIELD

## 2016-01-05 ENCOUNTER — Telehealth (HOSPITAL_COMMUNITY): Payer: Self-pay | Admitting: *Deleted

## 2016-01-05 ENCOUNTER — Other Ambulatory Visit: Payer: Self-pay | Admitting: Nurse Practitioner

## 2016-01-05 ENCOUNTER — Ambulatory Visit (INDEPENDENT_AMBULATORY_CARE_PROVIDER_SITE_OTHER): Payer: BLUE CROSS/BLUE SHIELD | Admitting: Nurse Practitioner

## 2016-01-05 ENCOUNTER — Encounter: Payer: Self-pay | Admitting: Nurse Practitioner

## 2016-01-05 VITALS — BP 102/78 | HR 82 | Ht 73.0 in | Wt 237.8 lb

## 2016-01-05 DIAGNOSIS — I10 Essential (primary) hypertension: Secondary | ICD-10-CM | POA: Diagnosis not present

## 2016-01-05 DIAGNOSIS — R9431 Abnormal electrocardiogram [ECG] [EKG]: Secondary | ICD-10-CM

## 2016-01-05 DIAGNOSIS — E785 Hyperlipidemia, unspecified: Secondary | ICD-10-CM | POA: Diagnosis not present

## 2016-01-05 DIAGNOSIS — Z8249 Family history of ischemic heart disease and other diseases of the circulatory system: Secondary | ICD-10-CM | POA: Diagnosis not present

## 2016-01-05 DIAGNOSIS — R55 Syncope and collapse: Secondary | ICD-10-CM

## 2016-01-05 NOTE — Patient Instructions (Addendum)
We will be checking the following labs today - NONE   Medication Instructions:    Continue with your current medicines. BUT  STOP your atenolol    Testing/Procedures To Be Arranged:  Stress Myoview  Event monitor  Follow-Up:   See Dr. Johnsie Cancel in about 6 weeks     Other Special Instructions:   Continue to monitor your blood pressure    If you need a refill on your cardiac medications before your next appointment, please call your pharmacy.   Call the [place] office at [phone] if you have any questions, problems or concerns.

## 2016-01-05 NOTE — Telephone Encounter (Signed)
Left message on voicemail in reference to upcoming appointment scheduled for 01/10/16. Phone number given for a call back so details instructions can be given. Hubbard Robinson, RN

## 2016-01-05 NOTE — Progress Notes (Signed)
CARDIOLOGY OFFICE NOTE  Date:  01/05/2016    Lucas Gentry Date of Birth: 1975-12-09 Medical Record [id number]  PCP:  Wenda Low, MD  Cardiologist:  Johnsie Cancel    Chief Complaint  Patient presents with  . Loss of Consciousness    Post hospital visit - seen for Dr. Johnsie Cancel    History of Present Illness: Lucas Gentry is a 40 y.o. male who presents today for a work in [place] [facility]. Seen for Dr. Johnsie Cancel.  He has a history of +FH for CAD, HTN, HLD, anxiety and lung nodule.  Seen here back in January after being referred by PCP due to concern for CAD in light of risk factors and FH.  Admitted earlier this month with a syncopal spell. Apparently awoke on his bathroom floor followed by persistent dizziness. No recollection of event leading up to the event. Was admitted. Had a concussion. Was not able to tolerate orthostatics due to dizziness and vomiting. He had had intentional weight loss over the prior 6 weeks. Felt to have some component of dehydration and his diuretics were stopped. Echo ok. MRI negative for acute stroke. Troponin negative. New T wave changes on EKG reported.   Comes in today. Here alone. He is doing ok. Back at work. He is driving. BP readings from home pretty low normal. No more spells. He has absolutely no recollection of the events leading up this syncopal event - notes that his "pants were still up". Feels a "little drunk" from his concussion but this is improving. No chest pain. Not short of breath. He has lost about 35 pounds over the past 6 weeks just by cutting out sugar, soft drinks, alcohol and bread. Occasionally will feel a "skip" in his heart. He does not exercise to any routine.   Past Medical History  Diagnosis Date  . Hyperlipidemia     takes Atorvastatin daily  . Hypertension     takes Atenolol and Diovan daily  . Seizures ([place])     hx of-as a child.Once tonsils removed no other seizures  . History of colon polyps     precancerous     Past Surgical History  Procedure Laterality Date  . Tonsillectomy    . Hernia repair Right 2014    inguinal  . Colonoscopy    . Lipoma excision Right 12/10/2015    Procedure: EXCISION LIPOMA RIGHT UPPER THIGH;  Surgeon: Georganna Skeans, MD;  Location: [facility];  Service: General;  Laterality: Right;     Medications: Current Outpatient Prescriptions  Medication Sig Dispense Refill  . ALPRAZolam (XANAX) 0.5 MG tablet Take 0.5 mg by mouth daily as needed for anxiety (flying).    Marland Kitchen aspirin 81 MG tablet Take 81 mg by mouth daily.    Marland Kitchen atenolol (TENORMIN) 25 MG tablet Take 25 mg by mouth at bedtime.  2  . atorvastatin (LIPITOR) 10 MG tablet Take 10 mg by mouth daily.    . Multiple Vitamins-Minerals (MULTIVITAL PO) Take 1 tablet by mouth daily.    . Omega-3 Fatty Acids (FISH OIL PO) Take 1 capsule by mouth daily.     No current facility-administered medications for this visit.    Allergies: Allergies  Allergen Reactions  . Adhesive [Tape] Rash and Other (See Comments)    75M bandaids glues makes blisters    Social History: The patient  reports that he has never smoked. He has never used smokeless tobacco. He reports that he drinks alcohol. He reports that he does  not use illicit drugs.   Family History: The patient's family history includes Cancer (age of onset: 79) in his paternal grandmother; Heart attack in his maternal grandfather; Hyperlipidemia in his father, mother, paternal grandfather, and paternal grandmother; Hypertension in his maternal grandmother and mother; Stroke in his paternal grandfather.   Review of Systems: Please see the history of present illness.   Otherwise, the review of systems is positive for none.   All other systems are reviewed and negative.   Physical Exam: VS:  BP 102/78 mmHg  Pulse 82  Ht 6\' 1"  (1.854 m)  Wt 237 lb 12.8 oz (107.865 kg)  BMI 31.38 kg/m2 .  BMI Body mass index is 31.38 kg/(m^2).  Wt Readings from Last 3 Encounters:  01/05/16  237 lb 12.8 oz (107.865 kg)  12/31/15 235 lb 4.8 oz (106.731 kg)  12/10/15 252 lb (114.306 kg)    General: Pleasant. Well developed, well nourished and in no acute distress.  HEENT: Normal. Neck: Supple, no JVD, carotid bruits, or masses noted.  Cardiac: Regular rate and rhythm. No murmurs, rubs, or gallops. No edema.  Respiratory:  Lungs are clear to auscultation bilaterally with normal work of breathing.  GI: Soft and nontender.  MS: No deformity or atrophy. Gait and ROM intact. Skin: Warm and dry. Color is normal.  Neuro:  Strength and sensation are intact and no gross focal deficits noted.  Psych: Alert, appropriate and with normal affect.   LABORATORY DATA:  EKG:  EKG is ordered today. This demonstrates NSR. His T wave changes are not present today.  Lab Results  Component Value Date   WBC 8.7 12/31/2015   HGB 12.8* 12/31/2015   HCT 40.4 12/31/2015   PLT 259 12/31/2015   GLUCOSE 138* 12/31/2015   ALT 35 12/30/2015   AST 27 12/30/2015   NA 143 12/31/2015   K 3.7 12/31/2015   CL 111 12/31/2015   CREATININE 1.05 12/31/2015   BUN 12 12/31/2015   CO2 23 12/31/2015   TSH 0.89 09/25/2014    BNP (last 3 results) No results for input(s): BNP in the last 8760 hours.  ProBNP (last 3 results) No results for input(s): PROBNP in the last 8760 hours.   Other Studies Reviewed Today:  Echo Study Conclusions from 12/2015  - Left ventricle: The cavity size was mildly dilated. Wall  thickness was normal. Systolic function was normal. The estimated  ejection fraction was in the range of 55% to 60%. Wall motion was  normal; there were no regional wall motion abnormalities. - Aortic valve: There was trivial regurgitation.  Impressions:  - Normal LV systolic function; probable grade 1 diastolic  dysfunction; trace AI, MR and TR.   GXT From 09/2014 Interpretation: normal - no evidence of ischemia by ST analysis  Comments: The patient had an good exercise  tolerance. There was no chest pain. There was an appropriate level of dyspnea. There were no arrhythmias, a normal heart rate response and normal BP response. There were no ischemic ST T wave changes and a normal heart rate recovery. The Duke Treadmill Score was 9 with low predicted risk.   Recommendations: No evidence of ischemia. Further plans per Dr. Johnsie Cancel.      Assessment/Plan: 1. Syncope - with associated concussion. No acute findings on CT or MRI - he has no recollection of events leading up to this spell. Place event monitor today.   2. HTN - BP very low normal - stopping Atenolol today. He is to continue to  monitor.  3. HLD - on statin therapy  4. +FH for CAD  5. Abnormal EKG - arrange for stress Myoview.   Current medicines are reviewed with the patient today.  The patient does not have concerns regarding medicines other than what has been noted above.  The following changes have been made:  See above.  Labs/ tests ordered today include:    Orders Placed This Encounter  Procedures  . Myocardial Perfusion Imaging  . Cardiac event monitor  . EKG 12-Lead     Disposition:   FU with Dr. Johnsie Cancel in about 5 to 6 weeks unless Myoview is abnormal.   Patient is agreeable to this plan and will call if any problems develop in the interim.   Signed: Burtis Junes, RN, ANP-C 01/05/2016 8:24 AM  [facility] [address] [place], [place]  [address] Phone: [phone] Fax: [phone]

## 2016-01-06 ENCOUNTER — Telehealth (HOSPITAL_COMMUNITY): Payer: Self-pay | Admitting: *Deleted

## 2016-01-06 NOTE — Telephone Encounter (Signed)
Patient given detailed instructions per Myocardial Perfusion Study Information Sheet for the test on 01/10/16 at 0730. Patient notified to arrive 15 minutes early and that it is imperative to arrive on time for appointment to keep from having the test rescheduled.  If you need to cancel or reschedule your appointment, please call the office within 24 hours of your appointment. Failure to do so may result in a cancellation of your appointment, and a $50 no show fee. Patient verbalized understanding.Lelynd Poer, [place]

## 2016-01-10 ENCOUNTER — Ambulatory Visit (HOSPITAL_COMMUNITY): Payer: BLUE CROSS/BLUE SHIELD | Attending: Cardiology

## 2016-01-10 DIAGNOSIS — I10 Essential (primary) hypertension: Secondary | ICD-10-CM | POA: Diagnosis not present

## 2016-01-10 DIAGNOSIS — Z8249 Family history of ischemic heart disease and other diseases of the circulatory system: Secondary | ICD-10-CM

## 2016-01-10 DIAGNOSIS — R55 Syncope and collapse: Secondary | ICD-10-CM | POA: Insufficient documentation

## 2016-01-10 DIAGNOSIS — R9431 Abnormal electrocardiogram [ECG] [EKG]: Secondary | ICD-10-CM | POA: Diagnosis not present

## 2016-01-10 DIAGNOSIS — E785 Hyperlipidemia, unspecified: Secondary | ICD-10-CM | POA: Diagnosis not present

## 2016-01-10 HISTORY — PX: NM MYOVIEW LTD: HXRAD82

## 2016-01-10 LAB — MYOCARDIAL PERFUSION IMAGING
Estimated workload: 10.1 METS
Exercise duration (min): 9 min
Exercise duration (sec): 0 s
LV dias vol: 117 mL (ref 62–150)
LV sys vol: 52 mL
MPHR: 180 {beats}/min
Peak HR: 181 {beats}/min
Percent HR: 100 %
RATE: 0.33
Rest HR: 64 {beats}/min
SDS: 2
SRS: 2
SSS: 4
TID: 0.94

## 2016-01-10 MED ORDER — TECHNETIUM TC 99M SESTAMIBI GENERIC - CARDIOLITE
32.7000 | Freq: Once | INTRAVENOUS | Status: AC | PRN
  Administered 2016-01-10: 32.7 via INTRAVENOUS

## 2016-01-10 MED ORDER — TECHNETIUM TC 99M SESTAMIBI GENERIC - CARDIOLITE
10.3000 | Freq: Once | INTRAVENOUS | Status: AC | PRN
  Administered 2016-01-10: 10 via INTRAVENOUS

## 2016-01-12 ENCOUNTER — Ambulatory Visit (INDEPENDENT_AMBULATORY_CARE_PROVIDER_SITE_OTHER): Payer: BLUE CROSS/BLUE SHIELD | Admitting: Podiatry

## 2016-01-12 ENCOUNTER — Encounter: Payer: Self-pay | Admitting: Podiatry

## 2016-01-12 ENCOUNTER — Ambulatory Visit: Payer: Self-pay

## 2016-01-12 ENCOUNTER — Ambulatory Visit (INDEPENDENT_AMBULATORY_CARE_PROVIDER_SITE_OTHER): Payer: BLUE CROSS/BLUE SHIELD

## 2016-01-12 VITALS — BP 124/85 | HR 90 | Resp 16 | Ht 73.0 in | Wt 227.0 lb

## 2016-01-12 DIAGNOSIS — M722 Plantar fascial fibromatosis: Secondary | ICD-10-CM

## 2016-01-12 DIAGNOSIS — M2040 Other hammer toe(s) (acquired), unspecified foot: Secondary | ICD-10-CM | POA: Diagnosis not present

## 2016-01-12 MED ORDER — TRIAMCINOLONE ACETONIDE 10 MG/ML IJ SUSP
10.0000 mg | Freq: Once | INTRAMUSCULAR | Status: AC
  Administered 2016-01-12: 10 mg

## 2016-01-12 NOTE — Progress Notes (Signed)
   Subjective:    Patient ID: Lucas Gentry, male    DOB: [birth year], 40 y.o.   MRN: [id number]  HPI Patient presents with bilateral foot pain; heel  Patient also presents with bilateral painful lesions; 5th toes-top of toes  Patient also presents with needing new orthotics made   Review of Systems  All other systems reviewed and are negative.      Objective:   Physical Exam        Assessment & Plan:

## 2016-01-12 NOTE — Progress Notes (Signed)
Subjective:     Patient ID: Lucas Gentry, male   DOB: [birth year], 40 y.o.   MRN: [id number]  HPI patient states that had a lot of pain in the bottom of both my heels and also with my fifth toes of both feet. I get these corns that I tried medication on and it's not helped me and it increasingly is difficult for wearing shoe gear comfortably. He also been sore again for about a year and a half and my orthotics have completely worn out   Review of Systems  All other systems reviewed and are negative.      Objective:   Physical Exam  Constitutional: He is oriented to person, place, and time.  Cardiovascular: Intact distal pulses.   Musculoskeletal: Normal range of motion.  Neurological: He is oriented to person, place, and time.  Skin: Skin is warm.  Nursing note and vitals reviewed.  neurovascular status intact muscle strength adequate range of motion within normal limits with patient found to have exquisite discomfort plantar aspect heel region bilateral with fluid buildup around the medial band. Significant diminishment of the arch height noted bilateral with mild discomfort on the navicular insertion posterior tibial tendon with keratotic lesions noted and rotation of the fifth digit bilateral with pain upon palpation. Good digital perfusion and well oriented 3     Assessment:     Acute plantar fasciitis bilateral with fluid buildup and hammertoe deformity fifth digit bilateral    Plan:     H&P and all conditions reviewed and x-rays reviewed with patient. Today I injected the plantar fascia bilateral 3 mg Kenalog 5 mg Xylocaine bilateral and applied fascial brace bilateral and scanned for custom orthotics to provide for Berkley type DP heels and support to the medial longitudinal arch. Debrided lesions on fifth toes and discussed eventual arthroplasty for these particular problems  Report indicated rotation fifth digits bilateral with significant depression of the arch and small  calcaneal spur formation

## 2016-02-02 ENCOUNTER — Ambulatory Visit: Payer: BLUE CROSS/BLUE SHIELD | Admitting: *Deleted

## 2016-02-02 DIAGNOSIS — M722 Plantar fascial fibromatosis: Secondary | ICD-10-CM

## 2016-02-02 NOTE — Patient Instructions (Signed)

## 2016-02-02 NOTE — Progress Notes (Signed)
Patient ID: Lucas Gentry, male   DOB: January 20, 1976, 40 y.o.   MRN: [id number] Patient presents for orthotic pick up.  Verbal and written break in and wear instructions given.  Patient will follow up in 4 weeks if symptoms worsen or fail to improve.

## 2016-02-22 NOTE — Progress Notes (Signed)
Patient ID: Lucas Gentry, male   DOB: [birth year], 40 y.o.   MRN: [id number]     CARDIOLOGY OFFICE NOTE  Date:  02/23/2016    Lucas Gentry Date of Birth: 18-Dec-1975 Medical Record [id number]  PCP:  Wenda Low, MD  Cardiologist:  Johnsie Cancel    Chief Complaint  Patient presents with  . Hypertension    no sx    History of Present Illness: Lucas Gentry is a 40 y.o. male who presents today  For f/u syncope   He has a history of +FH for CAD, HTN, HLD, anxiety and lung nodule.  Seen here back in January after being referred by PCP due to concern for CAD in light of risk factors and FH.  Admitted March 2016  with a syncopal spell. Apparently awoke on his bathroom floor followed by persistent dizziness. No recollection of event leading up to the event. Was admitted. Had a concussion. Was not able to tolerate orthostatics due to dizziness and vomiting. He had had intentional weight loss over the prior 6 weeks. Felt to have some component of dehydration and his diuretics were stopped. Echo ok. MRI negative for acute stroke. Troponin negative. New T wave changes on EKG reported.    F/U myovue 01/10/16 normal no ischemia EF 56% reviewed  01/05/16 Event monitor no arrhythmia   Doing well no recurrence:  Moving to [place] so daughter can go to [employer]  Past Medical History  Diagnosis Date  . Hyperlipidemia     takes Atorvastatin daily  . Hypertension     takes Atenolol and Diovan daily  . Seizures ([place])     hx of-as a child.Once tonsils removed no other seizures  . History of colon polyps     precancerous    Past Surgical History  Procedure Laterality Date  . Tonsillectomy    . Hernia repair Right 2014    inguinal  . Colonoscopy    . Lipoma excision Right 12/10/2015    Procedure: EXCISION LIPOMA RIGHT UPPER THIGH;  Surgeon: Georganna Skeans, MD;  Location: [facility];  Service: General;  Laterality: Right;     Medications: Current Outpatient Prescriptions  Medication Sig  Dispense Refill  . ALPRAZolam (XANAX) 0.5 MG tablet Take 0.5 mg by mouth daily as needed for anxiety (flying).    Marland Kitchen aspirin 81 MG tablet Take 81 mg by mouth daily.    Marland Kitchen atorvastatin (LIPITOR) 10 MG tablet Take 10 mg by mouth daily.    . Multiple Vitamins-Minerals (MULTIVITAL PO) Take 1 tablet by mouth daily.    . Omega-3 Fatty Acids (FISH OIL PO) Take 1 capsule by mouth daily.     No current facility-administered medications for this visit.    Allergies: Allergies  Allergen Reactions  . Adhesive [Tape] Rash and Other (See Comments)    87M bandaids glues makes blisters    Social History: The patient  reports that he has never smoked. He has never used smokeless tobacco. He reports that he drinks alcohol. He reports that he does not use illicit drugs.   Family History: The patient's family history includes Cancer (age of onset: 10) in his paternal grandmother; Heart attack in his maternal grandfather; Hyperlipidemia in his father, mother, paternal grandfather, and paternal grandmother; Hypertension in his maternal grandmother and mother; Stroke in his paternal grandfather.   Review of Systems: Please see the history of present illness.   Otherwise, the review of systems is positive for none.   All other systems are reviewed  and negative.   Physical Exam: VS:  BP 114/80 mmHg  Pulse 69  Ht 6\' 1"  (1.854 m)  Wt 101.061 kg (222 lb 12.8 oz)  BMI 29.40 kg/m2  SpO2 98% .  BMI Body mass index is 29.4 kg/(m^2).  Wt Readings from Last 3 Encounters:  02/23/16 101.061 kg (222 lb 12.8 oz)  01/12/16 102.967 kg (227 lb)  01/05/16 107.865 kg (237 lb 12.8 oz)    General: Pleasant. Well developed, well nourished and in no acute distress.  HEENT: Normal. Neck: Supple, no JVD, carotid bruits, or masses noted.  Cardiac: Regular rate and rhythm. No murmurs, rubs, or gallops. No edema.  Respiratory:  Lungs are clear to auscultation bilaterally with normal work of breathing.  GI: Soft and nontender.   MS: No deformity or atrophy. Gait and ROM intact. Skin: Warm and dry. Color is normal.  Neuro:  Strength and sensation are intact and no gross focal deficits noted.  Psych: Alert, appropriate and with normal affect.   LABORATORY DATA:  EKG:   01/05/16  NSR rate 78 normal   Lab Results  Component Value Date   WBC 8.7 12/31/2015   HGB 12.8* 12/31/2015   HCT 40.4 12/31/2015   PLT 259 12/31/2015   GLUCOSE 138* 12/31/2015   ALT 35 12/30/2015   AST 27 12/30/2015   NA 143 12/31/2015   K 3.7 12/31/2015   CL 111 12/31/2015   CREATININE 1.05 12/31/2015   BUN 12 12/31/2015   CO2 23 12/31/2015   TSH 0.89 09/25/2014    BNP (last 3 results) No results for input(s): BNP in the last 8760 hours.  ProBNP (last 3 results) No results for input(s): PROBNP in the last 8760 hours.   Other Studies Reviewed Today:  Echo Study Conclusions from 12/2015  - Left ventricle: The cavity size was mildly dilated. Wall  thickness was normal. Systolic function was normal. The estimated  ejection fraction was in the range of 55% to 60%. Wall motion was  normal; there were no regional wall motion abnormalities. - Aortic valve: There was trivial regurgitation.  Impressions:  - Normal LV systolic function; probable grade 1 diastolic  dysfunction; trace AI, MR and TR.   GXT From 09/2014 Interpretation: normal - no evidence of ischemia by ST analysis  Comments: The patient had an good exercise tolerance. There was no chest pain. There was an appropriate level of dyspnea. There were no arrhythmias, a normal heart rate response and normal BP response. There were no ischemic ST T wave changes and a normal heart rate recovery. The Duke Treadmill Score was 9 with low predicted risk.   Recommendations: No evidence of ischemia.       Assessment/Plan: 1. Syncope - with associated concussion. No acute findings on CT or MRI - he has no recollection of events leading up to this spell. Normal  EF No arrhythmia on event monitor and normal myovue  Echo also normal Will need ILR for any recurrence  2. HTN - beta blocker stopped BP much lower with weight loss   3. HLD - on statin therapy  4. +FH for CAD :  Normal myovue   5. Abnormal EKG - last ECG normal labile T waves follow    [name]

## 2016-02-23 ENCOUNTER — Ambulatory Visit (INDEPENDENT_AMBULATORY_CARE_PROVIDER_SITE_OTHER): Payer: BLUE CROSS/BLUE SHIELD | Admitting: Cardiovascular Disease

## 2016-02-23 ENCOUNTER — Encounter: Payer: Self-pay | Admitting: Cardiovascular Disease

## 2016-02-23 VITALS — BP 114/80 | HR 69 | Ht 73.0 in | Wt 222.8 lb

## 2016-02-23 DIAGNOSIS — I10 Essential (primary) hypertension: Secondary | ICD-10-CM

## 2016-02-23 NOTE — Patient Instructions (Addendum)

## 2016-02-28 DIAGNOSIS — M47812 Spondylosis without myelopathy or radiculopathy, cervical region: Secondary | ICD-10-CM | POA: Diagnosis not present

## 2016-02-28 DIAGNOSIS — M9901 Segmental and somatic dysfunction of cervical region: Secondary | ICD-10-CM | POA: Diagnosis not present

## 2016-02-28 DIAGNOSIS — M5030 Other cervical disc degeneration, unspecified cervical region: Secondary | ICD-10-CM | POA: Diagnosis not present

## 2016-02-28 DIAGNOSIS — M542 Cervicalgia: Secondary | ICD-10-CM | POA: Diagnosis not present

## 2016-03-20 DIAGNOSIS — M47812 Spondylosis without myelopathy or radiculopathy, cervical region: Secondary | ICD-10-CM | POA: Diagnosis not present

## 2016-03-20 DIAGNOSIS — M542 Cervicalgia: Secondary | ICD-10-CM | POA: Diagnosis not present

## 2016-03-20 DIAGNOSIS — M9901 Segmental and somatic dysfunction of cervical region: Secondary | ICD-10-CM | POA: Diagnosis not present

## 2016-03-20 DIAGNOSIS — M5030 Other cervical disc degeneration, unspecified cervical region: Secondary | ICD-10-CM | POA: Diagnosis not present

## 2016-04-03 DIAGNOSIS — M9901 Segmental and somatic dysfunction of cervical region: Secondary | ICD-10-CM | POA: Diagnosis not present

## 2016-04-03 DIAGNOSIS — M47812 Spondylosis without myelopathy or radiculopathy, cervical region: Secondary | ICD-10-CM | POA: Diagnosis not present

## 2016-04-03 DIAGNOSIS — M5030 Other cervical disc degeneration, unspecified cervical region: Secondary | ICD-10-CM | POA: Diagnosis not present

## 2016-04-03 DIAGNOSIS — M542 Cervicalgia: Secondary | ICD-10-CM | POA: Diagnosis not present

## 2016-04-25 DIAGNOSIS — M9901 Segmental and somatic dysfunction of cervical region: Secondary | ICD-10-CM | POA: Diagnosis not present

## 2016-04-25 DIAGNOSIS — M542 Cervicalgia: Secondary | ICD-10-CM | POA: Diagnosis not present

## 2016-04-25 DIAGNOSIS — M47812 Spondylosis without myelopathy or radiculopathy, cervical region: Secondary | ICD-10-CM | POA: Diagnosis not present

## 2016-04-25 DIAGNOSIS — M5030 Other cervical disc degeneration, unspecified cervical region: Secondary | ICD-10-CM | POA: Diagnosis not present

## 2016-05-09 DIAGNOSIS — M5030 Other cervical disc degeneration, unspecified cervical region: Secondary | ICD-10-CM | POA: Diagnosis not present

## 2016-05-09 DIAGNOSIS — M47812 Spondylosis without myelopathy or radiculopathy, cervical region: Secondary | ICD-10-CM | POA: Diagnosis not present

## 2016-05-09 DIAGNOSIS — M542 Cervicalgia: Secondary | ICD-10-CM | POA: Diagnosis not present

## 2016-05-09 DIAGNOSIS — M9901 Segmental and somatic dysfunction of cervical region: Secondary | ICD-10-CM | POA: Diagnosis not present

## 2016-05-30 DIAGNOSIS — M5030 Other cervical disc degeneration, unspecified cervical region: Secondary | ICD-10-CM | POA: Diagnosis not present

## 2016-05-30 DIAGNOSIS — M9901 Segmental and somatic dysfunction of cervical region: Secondary | ICD-10-CM | POA: Diagnosis not present

## 2016-05-30 DIAGNOSIS — M47812 Spondylosis without myelopathy or radiculopathy, cervical region: Secondary | ICD-10-CM | POA: Diagnosis not present

## 2016-05-30 DIAGNOSIS — M542 Cervicalgia: Secondary | ICD-10-CM | POA: Diagnosis not present

## 2016-06-08 DIAGNOSIS — Z125 Encounter for screening for malignant neoplasm of prostate: Secondary | ICD-10-CM | POA: Diagnosis not present

## 2016-06-08 DIAGNOSIS — Z23 Encounter for immunization: Secondary | ICD-10-CM | POA: Diagnosis not present

## 2016-06-08 DIAGNOSIS — Z0000 Encounter for general adult medical examination without abnormal findings: Secondary | ICD-10-CM | POA: Diagnosis not present

## 2016-06-08 DIAGNOSIS — Z1389 Encounter for screening for other disorder: Secondary | ICD-10-CM | POA: Diagnosis not present

## 2016-06-13 DIAGNOSIS — M5030 Other cervical disc degeneration, unspecified cervical region: Secondary | ICD-10-CM | POA: Diagnosis not present

## 2016-06-13 DIAGNOSIS — M9901 Segmental and somatic dysfunction of cervical region: Secondary | ICD-10-CM | POA: Diagnosis not present

## 2016-06-13 DIAGNOSIS — M542 Cervicalgia: Secondary | ICD-10-CM | POA: Diagnosis not present

## 2016-06-13 DIAGNOSIS — M47812 Spondylosis without myelopathy or radiculopathy, cervical region: Secondary | ICD-10-CM | POA: Diagnosis not present

## 2016-06-27 DIAGNOSIS — M5030 Other cervical disc degeneration, unspecified cervical region: Secondary | ICD-10-CM | POA: Diagnosis not present

## 2016-06-27 DIAGNOSIS — M47812 Spondylosis without myelopathy or radiculopathy, cervical region: Secondary | ICD-10-CM | POA: Diagnosis not present

## 2016-06-27 DIAGNOSIS — M9901 Segmental and somatic dysfunction of cervical region: Secondary | ICD-10-CM | POA: Diagnosis not present

## 2016-06-27 DIAGNOSIS — M542 Cervicalgia: Secondary | ICD-10-CM | POA: Diagnosis not present

## 2016-07-11 DIAGNOSIS — M9901 Segmental and somatic dysfunction of cervical region: Secondary | ICD-10-CM | POA: Diagnosis not present

## 2016-07-11 DIAGNOSIS — M47812 Spondylosis without myelopathy or radiculopathy, cervical region: Secondary | ICD-10-CM | POA: Diagnosis not present

## 2016-07-11 DIAGNOSIS — M5030 Other cervical disc degeneration, unspecified cervical region: Secondary | ICD-10-CM | POA: Diagnosis not present

## 2016-07-11 DIAGNOSIS — M542 Cervicalgia: Secondary | ICD-10-CM | POA: Diagnosis not present

## 2016-07-17 ENCOUNTER — Ambulatory Visit: Payer: Self-pay | Admitting: General Surgery

## 2016-08-01 DIAGNOSIS — Z23 Encounter for immunization: Secondary | ICD-10-CM | POA: Diagnosis not present

## 2016-08-08 DIAGNOSIS — M5030 Other cervical disc degeneration, unspecified cervical region: Secondary | ICD-10-CM | POA: Diagnosis not present

## 2016-08-08 DIAGNOSIS — M542 Cervicalgia: Secondary | ICD-10-CM | POA: Diagnosis not present

## 2016-08-08 DIAGNOSIS — M47812 Spondylosis without myelopathy or radiculopathy, cervical region: Secondary | ICD-10-CM | POA: Diagnosis not present

## 2016-08-08 DIAGNOSIS — M9901 Segmental and somatic dysfunction of cervical region: Secondary | ICD-10-CM | POA: Diagnosis not present

## 2016-08-10 ENCOUNTER — Encounter: Payer: Self-pay | Admitting: Cardiology

## 2016-08-24 NOTE — Progress Notes (Signed)
Cardiology Office Note   Date:  08/25/2016   ID:  [name], DOB [birth year], MRN [id number]  PCP:  Wenda Low, MD  Cardiologist:  Dr. Johnsie Cancel    Chief Complaint  Patient presents with  . Loss of Consciousness      History of Present Illness: Lucas Gentry is a 40 y.o. male who presents for syncope hx.   He has a history of +FH for CAD, HTN, HLD, anxiety and lung nodule.  Seen here back in January after being referred by PCP due to concern for CAD in light of risk factors and FH. Apparently awoke on his bathroom floor followed by persistent dizziness. No recollection of event leading up to the event. Was admitted. Had a concussion. Was not able to tolerate orthostatics due to dizziness and vomiting. He had had intentional weight loss over the prior 6 weeks. Felt to have some component of dehydration and his diuretics were stopped. Echo ok. MRI negative for acute stroke. Troponin negative. New T wave changes on EKG reported.   F/U myovue 01/10/16 normal no ischemia EF 56% reviewed  01/05/16 Event monitor no arrhythmia   Today he has no complaints no further syncope.  His BP is controlled he is on pseudoephedrine today.  He walks once or twice a day short distance but he does measure steps and is usually 8500 steps per day.  Men in his family have died by age 55.     Past Medical History:  Diagnosis Date  . History of colon polyps    precancerous  . Hyperlipidemia    takes Atorvastatin daily  . Hypertension    takes Atenolol and Diovan daily  . Seizures ([place])    hx of-as a child.Once tonsils removed no other seizures    Past Surgical History:  Procedure Laterality Date  . COLONOSCOPY    . HERNIA REPAIR Right 2014   inguinal  . LIPOMA EXCISION Right 12/10/2015   Procedure: EXCISION LIPOMA RIGHT UPPER THIGH;  Surgeon: Georganna Skeans, MD;  Location: [facility];  Service: General;  Laterality: Right;  . TONSILLECTOMY       Current Outpatient Prescriptions    Medication Sig Dispense Refill  . ALPRAZolam (XANAX) 0.5 MG tablet Take 0.5 mg by mouth daily as needed for anxiety (flying).    Marland Kitchen aspirin 81 MG tablet Take 81 mg by mouth daily.    Marland Kitchen atorvastatin (LIPITOR) 10 MG tablet Take 10 mg by mouth daily.    . Multiple Vitamins-Minerals (MULTIVITAL PO) Take 1 tablet by mouth daily.    . Omega-3 Fatty Acids (FISH OIL PO) Take 1 capsule by mouth daily.     No current facility-administered medications for this visit.     Allergies:   Adhesive [tape]    Social History:  The patient  reports that he has never smoked. He has never used smokeless tobacco. He reports that he drinks alcohol. He reports that he does not use drugs.   Family History:  The patient's family history includes Cancer (age of onset: 65) in his paternal grandmother; Heart attack in his maternal grandfather; Hyperlipidemia in his father, mother, paternal grandfather, and paternal grandmother; Hypertension in his maternal grandmother and mother; Stroke in his paternal grandfather.    ROS:  General:no colds or fevers, mild weight changes Skin:no rashes or ulcers HEENT:no blurred vision, no congestion CV:see HPI PUL:see HPI GI:no diarrhea constipation or melena, no indigestion GU:no hematuria, no dysuria MS:no joint pain, no claudication Neuro:no syncope, no lightheadedness  Endo:no diabetes, no thyroid disease  Wt Readings from Last 3 Encounters:  08/25/16 220 lb (99.8 kg)  02/23/16 222 lb 12.8 oz (101.1 kg)  01/12/16 227 lb (103 kg)     PHYSICAL EXAM: VS:  BP 120/82   Pulse 80   Ht 6' (1.829 m)   Wt 220 lb (99.8 kg)   BMI 29.84 kg/m  , BMI Body mass index is 29.84 kg/m. General:Pleasant affect, NAD Skin:Warm and dry, brisk capillary refill HEENT:normocephalic, sclera clear, mucus membranes moist Neck:supple, no JVD, no bruits  Heart:S1S2 RRR without murmur, gallup, rub or click Lungs:clear without rales, rhonchi, or wheezes [id number], non tender, + BS, do not  palpate liver spleen or masses Ext:no lower ext edema, 2+ pedal pulses, 2+ radial pulses Neuro:alert and oriented, MAE, follows commands, + facial symmetry    EKG:  EKG is ordered today. The ekg ordered today demonstrates SR no changes from previous tracing.    Recent Labs: 12/30/2015: ALT 35 12/31/2015: BUN 12; Creatinine, Ser 1.05; Hemoglobin 12.8; Platelets 259; Potassium 3.7; Sodium 143    Lipid Panel No results found for: CHOL, TRIG, HDL, CHOLHDL, VLDL, LDLCALC, LDLDIRECT     Other studies Reviewed: Additional studies/ records that were reviewed today include: . Echo 12/2015 Study Conclusions  - Left ventricle: The cavity size was mildly dilated. Wall   thickness was normal. Systolic function was normal. The estimated   ejection fraction was in the range of 55% to 60%. Wall motion was   normal; there were no regional wall motion abnormalities. - Aortic valve: There was trivial regurgitation.  Impressions:  - Normal LV systolic function; probable grade 1 diastolic   dysfunction; trace AI, MR and TR.  Nuc study 12/2015 Study Highlights    Nuclear stress EF: 56%.  There was no ST segment deviation noted during stress.  The study is normal.  This is a low risk study.  The left ventricular ejection fraction is normal (55-65%).   Normal resting and stress perfusion EF 56%     ASSESSMENT AND PLAN:  1. Syncope - with associated concussion. No acute findings on CT or MRI - he has no recollection of events leading up to this spell. Normal EF No arrhythmia on event monitor and normal myovue  Echo also normal Will need ILR for any recurrence Since last visit no recurrence. Thought to be decreased BP on BB with wt. loss  2. HTN - beta blocker stopped BP much lower with weight loss and has been stable.   3. HLD - on statin therapy  4. +FH for CAD :  Normal myovue   5. Abnormal EKG - last ECG normal labile T waves follow - today stable.  Current medicines  are reviewed with the patient today.  The patient Has no concerns regarding medicines.  The following changes have been made:  See above Labs/ tests ordered today include:see above  Disposition:   FU:  see above  Signed, Cecilie Kicks, NP  08/25/2016 8:21 AM    [facility] [place], [place], [address] [place], [place] Phone: [phone]; Fax: [phone]

## 2016-08-25 ENCOUNTER — Encounter: Payer: Self-pay | Admitting: Cardiology

## 2016-08-25 ENCOUNTER — Ambulatory Visit (INDEPENDENT_AMBULATORY_CARE_PROVIDER_SITE_OTHER): Payer: BLUE CROSS/BLUE SHIELD | Admitting: Cardiology

## 2016-08-25 VITALS — BP 120/82 | HR 80 | Ht 72.0 in | Wt 220.0 lb

## 2016-08-25 DIAGNOSIS — I10 Essential (primary) hypertension: Secondary | ICD-10-CM

## 2016-08-25 DIAGNOSIS — R55 Syncope and collapse: Secondary | ICD-10-CM | POA: Diagnosis not present

## 2016-08-25 DIAGNOSIS — R9431 Abnormal electrocardiogram [ECG] [EKG]: Secondary | ICD-10-CM

## 2016-08-25 DIAGNOSIS — E782 Mixed hyperlipidemia: Secondary | ICD-10-CM | POA: Diagnosis not present

## 2016-08-25 DIAGNOSIS — Z8249 Family history of ischemic heart disease and other diseases of the circulatory system: Secondary | ICD-10-CM | POA: Diagnosis not present

## 2016-08-25 NOTE — Patient Instructions (Signed)
Medication Instructions:  Your physician recommends that you continue on your current medications as directed. Please refer to the Current Medication list given to you today.   Labwork: -None  Testing/Procedures: -None  Follow-Up: Your physician recommends that you schedule keep your follow-up appointment with Dr. Johnsie Cancel, March 7 @ 8:00 am   Any Other Special Instructions Will Be Listed Below (If Applicable).     If you need a refill on your cardiac medications before your next appointment, please call your pharmacy.

## 2016-11-21 DIAGNOSIS — J019 Acute sinusitis, unspecified: Secondary | ICD-10-CM | POA: Diagnosis not present

## 2016-11-21 DIAGNOSIS — J029 Acute pharyngitis, unspecified: Secondary | ICD-10-CM | POA: Diagnosis not present

## 2016-11-21 DIAGNOSIS — J101 Influenza due to other identified influenza virus with other respiratory manifestations: Secondary | ICD-10-CM | POA: Diagnosis not present

## 2016-12-07 DIAGNOSIS — E782 Mixed hyperlipidemia: Secondary | ICD-10-CM | POA: Diagnosis not present

## 2016-12-07 DIAGNOSIS — J309 Allergic rhinitis, unspecified: Secondary | ICD-10-CM | POA: Diagnosis not present

## 2016-12-07 DIAGNOSIS — I10 Essential (primary) hypertension: Secondary | ICD-10-CM | POA: Diagnosis not present

## 2016-12-12 NOTE — Progress Notes (Signed)
Cardiology Office Note   Date:  12/20/2016   ID:  Lucas Gentry, DOB 09-Mar-1976, MRN [id number]  PCP:  Wenda Low, MD  Cardiologist:  Dr. Johnsie Cancel    Chief Complaint  Patient presents with  . 4 month f/u      History of Present Illness: Lucas Gentry is a 41 y.o. male who presents for syncope hx.   He has a history of +FH for CAD, HTN, HLD, anxiety and lung nodule.  Seen here back in January 2017  after being referred by PCP due to concern for CAD in light of risk factors and FH. Apparently awoke on his bathroom floor followed by persistent dizziness. No recollection of event leading up to the event. Was admitted. Had a concussion. Was not able to tolerate orthostatics due to dizziness and vomiting. He had had intentional weight loss over the prior 6 weeks. Felt to have some component of dehydration and his diuretics were stopped. Echo ok. MRI negative for acute stroke. Troponin negative. New T wave changes on EKG reported.   F/U myovue 01/10/16 normal no ischemia EF 56% reviewed  01/05/16 Event monitor no arrhythmia   Today he has no complaints no further syncope.  His BP is controlled  He walks once or twice a day short distance but he does measure steps and is usually 8500 steps per day.  Men in his family have died by age 52.   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    Past Medical History:  Diagnosis Date  . History of colon polyps    precancerous  . Hyperlipidemia    takes Atorvastatin daily  . Hypertension    takes Atenolol and Diovan daily  . Seizures ([place])    hx of-as a child.Once tonsils removed no other seizures    Past Surgical History:  Procedure Laterality Date  . COLONOSCOPY    . HERNIA REPAIR Right 2014   inguinal  . LIPOMA EXCISION Right 12/10/2015   Procedure: EXCISION LIPOMA RIGHT UPPER THIGH;  Surgeon: Georganna Skeans, MD;  Location: [facility];  Service: General;  Laterality: Right;  .  TONSILLECTOMY       Current Outpatient Prescriptions  Medication Sig Dispense Refill  . ALPRAZolam (XANAX) 0.5 MG tablet Take 0.5 mg by mouth daily as needed for anxiety (flying).    Marland Kitchen aspirin 81 MG tablet Take 81 mg by mouth daily.    Marland Kitchen atorvastatin (LIPITOR) 10 MG tablet Take 10 mg by mouth daily.    Marland Kitchen CALCIUM PO Take 4 tablets by mouth daily.     . IRON PO Take 1 tablet by mouth daily.    Marland Kitchen loratadine (ALLERGY) 10 MG tablet Take 10 mg by mouth daily.    . Multiple Vitamins-Minerals (MULTIVITAL PO) Take 1 tablet by mouth daily.    . Omega-3 Fatty Acids (FISH OIL PO) Take 1 capsule by mouth daily.    Marland Kitchen OVER THE COUNTER MEDICATION Take 1 tablet by mouth daily. Carcumin     No current facility-administered medications for this visit.     Allergies:   Adhesive [tape]    Social History:  The patient  reports that he has never smoked. He has never used smokeless tobacco. He reports that he drinks alcohol. He reports that he does not use drugs.   Family History:  The patient's family history includes Cancer (age of onset: 37) in his paternal grandmother; Heart attack in  his maternal grandfather; Hyperlipidemia in his father, mother, paternal grandfather, and paternal grandmother; Hypertension in his maternal grandmother and mother; Stroke in his paternal grandfather.    ROS:  General:no colds or fevers, mild weight changes Skin:no rashes or ulcers HEENT:no blurred vision, no congestion CV:see HPI PUL:see HPI GI:no diarrhea constipation or melena, no indigestion GU:no hematuria, no dysuria MS:no joint pain, no claudication Neuro:no syncope, no lightheadedness Endo:no diabetes, no thyroid disease  Wt Readings from Last 3 Encounters:  12/20/16 216 lb 12.8 oz (98.3 kg)  08/25/16 220 lb (99.8 kg)  02/23/16 222 lb 12.8 oz (101.1 kg)     PHYSICAL EXAM: VS:  BP 110/80   Pulse 80   Ht 6' (1.829 m)   Wt 216 lb 12.8 oz (98.3 kg)   SpO2 98%   BMI 29.40 kg/m  , BMI Body mass index is  29.4 kg/m. General:Pleasant affect, NAD Skin:Warm and dry, brisk capillary refill HEENT:normocephalic, sclera clear, mucus membranes moist Neck:supple, no JVD, no bruits  Heart:S1S2 RRR without murmur, gallup, rub or click Lungs:clear without rales, rhonchi, or wheezes [id number], non tender, + BS, do not palpate liver spleen or masses Ext:no lower ext edema, 2+ pedal pulses, 2+ radial pulses Neuro:alert and oriented, MAE, follows commands, + facial symmetry    EKG:   SR  Rate 80 normal 08/25/16    Recent Labs: 12/30/2015: ALT 35 12/31/2015: BUN 12; Creatinine, Ser 1.05; Hemoglobin 12.8; Platelets 259; Potassium 3.7; Sodium 143    Lipid Panel No results found for: CHOL, TRIG, HDL, CHOLHDL, VLDL, LDLCALC, LDLDIRECT     Other studies Reviewed: Additional studies/ records that were reviewed today include: . Echo 12/2015 Study Conclusions  - Left ventricle: The cavity size was mildly dilated. Wall   thickness was normal. Systolic function was normal. The estimated   ejection fraction was in the range of 55% to 60%. Wall motion was   normal; there were no regional wall motion abnormalities. - Aortic valve: There was trivial regurgitation.  Impressions:  - Normal LV systolic function; probable grade 1 diastolic   dysfunction; trace AI, MR and TR.  Nuc study 12/2015 Study Highlights    Nuclear stress EF: 56%.  There was no ST segment deviation noted during stress.  The study is normal.  This is a low risk study.  The left ventricular ejection fraction is normal (55-65%).   Normal resting and stress perfusion EF 56%     ASSESSMENT AND PLAN:  1. Syncope - with associated concussion. No acute findings on CT or MRI - he has no recollection of events leading up to this spell. Normal EF No arrhythmia on event monitor and normal myovue  Echo also normal Will need ILR for any recurrence Since last visit no recurrence. Thought to be decreased BP on BB with wt. loss  2.  HTN - beta blocker stopped BP much lower with weight loss and has been stable.   3. HLD - on statin therapy repeat labs with [facility] as triglycerides were high   4. +FH for CAD :  Normal myovue consider coronary calcium score f/u  0 on 09/21/14    5. Abnormal EKG - last ECG normal labile T waves follow - today stable.  Current medicines are reviewed with the patient today.  The patient Has no concerns regarding medicines.  The following changes have been made:  See above Labs/ tests ordered today include:see above  F/U in a year  [name]

## 2016-12-20 ENCOUNTER — Ambulatory Visit (INDEPENDENT_AMBULATORY_CARE_PROVIDER_SITE_OTHER): Payer: BLUE CROSS/BLUE SHIELD | Admitting: Cardiovascular Disease

## 2016-12-20 ENCOUNTER — Encounter: Payer: Self-pay | Admitting: Cardiovascular Disease

## 2016-12-20 VITALS — BP 110/80 | HR 80 | Ht 72.0 in | Wt 216.8 lb

## 2016-12-20 DIAGNOSIS — E782 Mixed hyperlipidemia: Secondary | ICD-10-CM | POA: Diagnosis not present

## 2016-12-20 DIAGNOSIS — I10 Essential (primary) hypertension: Secondary | ICD-10-CM

## 2016-12-20 NOTE — Patient Instructions (Addendum)

## 2017-01-17 DIAGNOSIS — E782 Mixed hyperlipidemia: Secondary | ICD-10-CM | POA: Diagnosis not present

## 2017-04-04 DIAGNOSIS — M546 Pain in thoracic spine: Secondary | ICD-10-CM | POA: Diagnosis not present

## 2017-04-04 DIAGNOSIS — M545 Low back pain: Secondary | ICD-10-CM | POA: Diagnosis not present

## 2017-04-04 DIAGNOSIS — M461 Sacroiliitis, not elsewhere classified: Secondary | ICD-10-CM | POA: Diagnosis not present

## 2017-04-04 DIAGNOSIS — M542 Cervicalgia: Secondary | ICD-10-CM | POA: Diagnosis not present

## 2017-05-01 DIAGNOSIS — M542 Cervicalgia: Secondary | ICD-10-CM | POA: Diagnosis not present

## 2017-05-01 DIAGNOSIS — M546 Pain in thoracic spine: Secondary | ICD-10-CM | POA: Diagnosis not present

## 2017-05-01 DIAGNOSIS — M461 Sacroiliitis, not elsewhere classified: Secondary | ICD-10-CM | POA: Diagnosis not present

## 2017-05-01 DIAGNOSIS — M545 Low back pain: Secondary | ICD-10-CM | POA: Diagnosis not present

## 2017-05-29 DIAGNOSIS — M545 Low back pain: Secondary | ICD-10-CM | POA: Diagnosis not present

## 2017-05-29 DIAGNOSIS — M461 Sacroiliitis, not elsewhere classified: Secondary | ICD-10-CM | POA: Diagnosis not present

## 2017-05-29 DIAGNOSIS — M546 Pain in thoracic spine: Secondary | ICD-10-CM | POA: Diagnosis not present

## 2017-05-29 DIAGNOSIS — M542 Cervicalgia: Secondary | ICD-10-CM | POA: Diagnosis not present

## 2017-06-04 DIAGNOSIS — M25572 Pain in left ankle and joints of left foot: Secondary | ICD-10-CM | POA: Diagnosis not present

## 2017-06-20 DIAGNOSIS — R7301 Impaired fasting glucose: Secondary | ICD-10-CM | POA: Diagnosis not present

## 2017-06-20 DIAGNOSIS — E782 Mixed hyperlipidemia: Secondary | ICD-10-CM | POA: Diagnosis not present

## 2017-06-20 DIAGNOSIS — Z125 Encounter for screening for malignant neoplasm of prostate: Secondary | ICD-10-CM | POA: Diagnosis not present

## 2017-06-20 DIAGNOSIS — I10 Essential (primary) hypertension: Secondary | ICD-10-CM | POA: Diagnosis not present

## 2017-06-20 DIAGNOSIS — Z0000 Encounter for general adult medical examination without abnormal findings: Secondary | ICD-10-CM | POA: Diagnosis not present

## 2017-06-26 DIAGNOSIS — M542 Cervicalgia: Secondary | ICD-10-CM | POA: Diagnosis not present

## 2017-06-26 DIAGNOSIS — M545 Low back pain: Secondary | ICD-10-CM | POA: Diagnosis not present

## 2017-06-26 DIAGNOSIS — M461 Sacroiliitis, not elsewhere classified: Secondary | ICD-10-CM | POA: Diagnosis not present

## 2017-06-26 DIAGNOSIS — M546 Pain in thoracic spine: Secondary | ICD-10-CM | POA: Diagnosis not present

## 2017-07-24 DIAGNOSIS — M545 Low back pain: Secondary | ICD-10-CM | POA: Diagnosis not present

## 2017-07-24 DIAGNOSIS — M461 Sacroiliitis, not elsewhere classified: Secondary | ICD-10-CM | POA: Diagnosis not present

## 2017-07-24 DIAGNOSIS — M546 Pain in thoracic spine: Secondary | ICD-10-CM | POA: Diagnosis not present

## 2017-07-24 DIAGNOSIS — M542 Cervicalgia: Secondary | ICD-10-CM | POA: Diagnosis not present

## 2017-08-14 NOTE — Progress Notes (Signed)
Cardiology Office Note   Date:  08/17/2017   ID:  Lucas Gentry, DOB 03-11-1976, MRN [id number]  PCP:  Wenda Low, MD  Cardiologist:  Dr. Johnsie Cancel    No chief complaint on file.     History of Present Illness: Lucas Gentry is a 41 y.o. male who presents for f/u of syncope   He has a history of +FH for CAD, HTN, HLD, anxiety and lung nodule.  Seen here back in January 2017  after being referred by PCP due to concern for CAD in light of risk factors and FH. Apparently awoke on his bathroom floor followed by persistent dizziness. No recollection of event leading up to the event. Was admitted. Had a concussion. Was not able to tolerate orthostatics due to dizziness and vomiting. He had had intentional weight loss over the prior 6 weeks. Felt to have some component of dehydration and his diuretics were stopped. Echo ok. MRI negative for acute stroke. Troponin negative. New T wave changes on EKG reported.   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 Some elevated BP readings He uses xanax sometimes and BP comes down  Moved to [place] so his 5/8 yo Kids could go to [employer].  Episode of nausea on golf course lasted a few hours noted BP up     Past Medical History:  Diagnosis Date  . History of colon polyps    precancerous  . Hyperlipidemia    takes Atorvastatin daily  . Hypertension    takes Atenolol and Diovan daily  . Seizures ([place])    hx of-as a child.Once tonsils removed no other seizures    Past Surgical History:  Procedure Laterality Date  . COLONOSCOPY    . HERNIA REPAIR Right 2014   inguinal  . LIPOMA EXCISION Right 12/10/2015   Procedure: EXCISION LIPOMA RIGHT UPPER THIGH;  Surgeon: Georganna Skeans, MD;  Location: [facility];  Service: General;  Laterality: Right;  . TONSILLECTOMY        Current Outpatient Prescriptions  Medication Sig Dispense Refill  . ALPRAZolam (XANAX) 0.5 MG tablet Take 0.5 mg by mouth daily as needed for anxiety (flying).    Marland Kitchen aspirin 81 MG tablet Take 81 mg by mouth daily.    Marland Kitchen atorvastatin (LIPITOR) 10 MG tablet Take 10 mg by mouth daily.    Marland Kitchen CALCIUM PO Take 4 tablets by mouth daily.     . IRON PO Take 1 tablet by mouth daily.    Marland Kitchen loratadine (ALLERGY) 10 MG tablet Take 10 mg by mouth daily.    . Multiple Vitamins-Minerals (MULTIVITAL PO) Take 1 tablet by mouth daily.    . Omega-3 Fatty Acids (FISH OIL PO) Take 1 capsule by mouth daily.    Marland Kitchen OVER THE COUNTER MEDICATION Take 1 tablet by mouth daily. Carcumin     No current facility-administered medications for this visit.     Allergies:   Adhesive [tape]    Social History:  The patient  reports that he has never smoked. He has never used smokeless tobacco. He reports that he drinks alcohol. He reports that he does not use drugs.   Family History:  The patient's family history includes Cancer (age of onset: 27) in his paternal grandmother; Heart attack in his maternal grandfather; Hyperlipidemia in his father, mother, paternal grandfather,  and paternal grandmother; Hypertension in his maternal grandmother and mother; Stroke in his paternal grandfather.    ROS:  General:no colds or fevers, mild weight changes Skin:no rashes or ulcers HEENT:no blurred vision, no congestion CV:see HPI PUL:see HPI GI:no diarrhea constipation or melena, no indigestion GU:no hematuria, no dysuria MS:no joint pain, no claudication Neuro:no syncope, no lightheadedness Endo:no diabetes, no thyroid disease  Wt Readings from Last 3 Encounters:  08/17/17 223 lb 6.4 oz (101.3 kg)  12/20/16 216 lb 12.8 oz (98.3 kg)  08/25/16 220 lb (99.8 kg)     PHYSICAL EXAM: VS:  BP 122/88   Pulse 65   Ht 6' (1.829 m)   Wt 223 lb 6.4 oz (101.3 kg)   SpO2 98%   BMI 30.30 kg/m  , BMI Body mass index is 30.3  kg/m. Affect appropriate Healthy:  appears stated age 41: normal Neck supple with no adenopathy JVP normal no bruits no thyromegaly Lungs clear with no wheezing and good diaphragmatic motion Heart:  S1/S2 no murmur, no rub, gallop or click PMI normal Abdomen: benighn, BS positve, no tenderness, no AAA no bruit.  No HSM or HJR Distal pulses intact with no bruits No edema Neuro non-focal Skin warm and dry No muscular weakness     EKG:   SR  Rate 80 normal 08/25/16 08/17/17 SR early repol    Recent Labs: No results found for requested labs within last 8760 hours.    Lipid Panel No results found for: CHOL, TRIG, HDL, CHOLHDL, VLDL, LDLCALC, LDLDIRECT     Other studies Reviewed: Additional studies/ records that were reviewed today include: . Echo 12/2015 Study Conclusions  - Left ventricle: The cavity size was mildly dilated. Wall   thickness was normal. Systolic function was normal. The estimated   ejection fraction was in the range of 55% to 60%. Wall motion was   normal; there were no regional wall motion abnormalities. - Aortic valve: There was trivial regurgitation.  Impressions:  - Normal LV systolic function; probable grade 1 diastolic   dysfunction; trace AI, MR and TR.  Nuc study 12/2015 Study Highlights    Nuclear stress EF: 56%.  There was no ST segment deviation noted during stress.  The study is normal.  This is a low risk study.  The left ventricular ejection fraction is normal (55-65%).   Normal resting and stress perfusion EF 56%     ASSESSMENT AND PLAN:  1. Syncope - with associated concussion. No acute findings on CT or MRI - he has no recollection of events leading up to this spell.  No arrhythmia on event monitor and normal myovue  Echo also normal Will need ILR for any recurrence Since last visit no recurrence. Thought to be decreased BP on BB with wt. loss  2. HTN - borderline prefer life style changes weight loss, decrease  ETOH and diet over pill  3. HLD - on statin therapy repeat labs with [facility] as triglycerides were high   4. +FH for CAD :  Normal myovue consider coronary calcium score f/u  0 on 09/21/14    5. Abnormal EKG - last ECG normal labile T waves follow - today stable.  Current medicines are reviewed with the patient today.  The patient Has no concerns regarding medicines.  The following changes have been made:  See above Labs/ tests ordered today include:Calcium score   F/U in a year  [name]

## 2017-08-16 HISTORY — PX: OTHER SURGICAL HISTORY: SHX169

## 2017-08-17 ENCOUNTER — Encounter: Payer: Self-pay | Admitting: Cardiovascular Disease

## 2017-08-17 ENCOUNTER — Ambulatory Visit (INDEPENDENT_AMBULATORY_CARE_PROVIDER_SITE_OTHER): Payer: BLUE CROSS/BLUE SHIELD | Admitting: Cardiovascular Disease

## 2017-08-17 ENCOUNTER — Ambulatory Visit (INDEPENDENT_AMBULATORY_CARE_PROVIDER_SITE_OTHER)
Admission: RE | Admit: 2017-08-17 | Discharge: 2017-08-17 | Disposition: A | Discharge status: Home / Self Care | Payer: Self-pay | Source: Ambulatory Visit | Attending: Cardiovascular Disease | Admitting: Cardiovascular Disease

## 2017-08-17 VITALS — BP 122/88 | HR 65 | Ht 72.0 in | Wt 223.4 lb

## 2017-08-17 DIAGNOSIS — I10 Essential (primary) hypertension: Secondary | ICD-10-CM | POA: Diagnosis not present

## 2017-08-17 DIAGNOSIS — R55 Syncope and collapse: Secondary | ICD-10-CM

## 2017-08-17 NOTE — Patient Instructions (Addendum)
Medication Instructions:  Your physician recommends that you continue on your current medications as directed. Please refer to the Current Medication list given to you today.  Labwork: NONE  Testing/Procedures: Cardiac CT Calcium score scanning, (CAT scanning), is a noninvasive, special x-ray that produces cross-sectional images of the body using x-rays and a computer. CT scans help physicians diagnose and treat medical conditions. For some CT exams, a contrast material is used to enhance visibility in the area of the body being studied. CT scans provide greater clarity and reveal more details than regular x-ray exams.  Follow-Up: Your physician wants you to follow-up in: 12 months with Dr. Johnsie Cancel. You will receive a reminder letter in the mail two months in advance. If you don't receive a letter, please call our office to schedule the follow-up appointment.   If you need a refill on your cardiac medications before your next appointment, please call your pharmacy.

## 2017-08-20 ENCOUNTER — Telehealth: Payer: Self-pay

## 2017-08-20 ENCOUNTER — Ambulatory Visit (INDEPENDENT_AMBULATORY_CARE_PROVIDER_SITE_OTHER)
Admission: RE | Admit: 2017-08-20 | Discharge: 2017-08-20 | Disposition: A | Discharge status: Home / Self Care | Payer: BLUE CROSS/BLUE SHIELD | Source: Ambulatory Visit | Attending: Cardiovascular Disease | Admitting: Cardiovascular Disease

## 2017-08-20 DIAGNOSIS — K769 Liver disease, unspecified: Secondary | ICD-10-CM | POA: Diagnosis not present

## 2017-08-20 DIAGNOSIS — D1809 Hemangioma of other sites: Secondary | ICD-10-CM | POA: Diagnosis not present

## 2017-08-20 MED ORDER — IOPAMIDOL (ISOVUE-300) INJECTION 61%
100.0000 mL | Freq: Once | INTRAVENOUS | Status: AC | PRN
  Administered 2017-08-20: 100 mL via INTRAVENOUS

## 2017-08-20 NOTE — Telephone Encounter (Signed)
-----   Message from Josue Hector, MD sent at 08/20/2017  8:25 AM EST ----- Calcium score 0 great needs f/u for ? Liver lesion

## 2017-08-20 NOTE — Telephone Encounter (Signed)
Patient aware of CT results. Per Dr. Johnsie Cancel, Calcium score 0, great. However radiology concerned about liver lesion see report and order abdominal CT with contrast per their recommendation. Will forward copy to PCP.  Patient verbalized understanding. Stacy in CT will call to schedule patient today.

## 2017-08-21 DIAGNOSIS — I10 Essential (primary) hypertension: Secondary | ICD-10-CM | POA: Diagnosis not present

## 2017-08-21 DIAGNOSIS — D1803 Hemangioma of intra-abdominal structures: Secondary | ICD-10-CM | POA: Diagnosis not present

## 2017-08-21 DIAGNOSIS — F419 Anxiety disorder, unspecified: Secondary | ICD-10-CM | POA: Diagnosis not present

## 2017-08-23 ENCOUNTER — Telehealth: Payer: Self-pay | Admitting: *Deleted

## 2017-08-23 NOTE — Telephone Encounter (Signed)
Follow Up:; ° ° °Returning your call. °

## 2017-08-23 NOTE — Telephone Encounter (Signed)
LMOP VM TO CALL BACK FOR RESULTS

## 2017-08-23 NOTE — Telephone Encounter (Signed)
CALLED PT BACK NO ANSWER WILL CALL TOMORROW 08-24-17

## 2017-08-28 DIAGNOSIS — M545 Low back pain: Secondary | ICD-10-CM | POA: Diagnosis not present

## 2017-08-28 DIAGNOSIS — M461 Sacroiliitis, not elsewhere classified: Secondary | ICD-10-CM | POA: Diagnosis not present

## 2017-08-28 DIAGNOSIS — M546 Pain in thoracic spine: Secondary | ICD-10-CM | POA: Diagnosis not present

## 2017-08-28 DIAGNOSIS — M542 Cervicalgia: Secondary | ICD-10-CM | POA: Diagnosis not present

## 2017-10-02 DIAGNOSIS — M461 Sacroiliitis, not elsewhere classified: Secondary | ICD-10-CM | POA: Diagnosis not present

## 2017-10-02 DIAGNOSIS — M545 Low back pain: Secondary | ICD-10-CM | POA: Diagnosis not present

## 2017-10-02 DIAGNOSIS — M546 Pain in thoracic spine: Secondary | ICD-10-CM | POA: Diagnosis not present

## 2017-10-02 DIAGNOSIS — M542 Cervicalgia: Secondary | ICD-10-CM | POA: Diagnosis not present

## 2017-10-30 DIAGNOSIS — M542 Cervicalgia: Secondary | ICD-10-CM | POA: Diagnosis not present

## 2017-10-30 DIAGNOSIS — M461 Sacroiliitis, not elsewhere classified: Secondary | ICD-10-CM | POA: Diagnosis not present

## 2017-10-30 DIAGNOSIS — M545 Low back pain: Secondary | ICD-10-CM | POA: Diagnosis not present

## 2017-10-30 DIAGNOSIS — M546 Pain in thoracic spine: Secondary | ICD-10-CM | POA: Diagnosis not present

## 2017-11-09 DIAGNOSIS — Z01818 Encounter for other preprocedural examination: Secondary | ICD-10-CM | POA: Diagnosis not present

## 2017-11-13 DIAGNOSIS — I10 Essential (primary) hypertension: Secondary | ICD-10-CM | POA: Diagnosis not present

## 2017-11-13 DIAGNOSIS — J069 Acute upper respiratory infection, unspecified: Secondary | ICD-10-CM | POA: Diagnosis not present

## 2017-11-13 DIAGNOSIS — F419 Anxiety disorder, unspecified: Secondary | ICD-10-CM | POA: Diagnosis not present

## 2017-11-13 DIAGNOSIS — Z1389 Encounter for screening for other disorder: Secondary | ICD-10-CM | POA: Diagnosis not present

## 2017-11-27 DIAGNOSIS — M542 Cervicalgia: Secondary | ICD-10-CM | POA: Diagnosis not present

## 2017-11-27 DIAGNOSIS — M546 Pain in thoracic spine: Secondary | ICD-10-CM | POA: Diagnosis not present

## 2017-11-27 DIAGNOSIS — M545 Low back pain: Secondary | ICD-10-CM | POA: Diagnosis not present

## 2017-11-27 DIAGNOSIS — M461 Sacroiliitis, not elsewhere classified: Secondary | ICD-10-CM | POA: Diagnosis not present

## 2017-12-07 DIAGNOSIS — Z86010 Personal history of colonic polyps: Secondary | ICD-10-CM | POA: Diagnosis not present

## 2017-12-08 DIAGNOSIS — K610 Anal abscess: Secondary | ICD-10-CM | POA: Diagnosis not present

## 2017-12-08 DIAGNOSIS — K649 Unspecified hemorrhoids: Secondary | ICD-10-CM | POA: Diagnosis not present

## 2017-12-18 DIAGNOSIS — J309 Allergic rhinitis, unspecified: Secondary | ICD-10-CM | POA: Diagnosis not present

## 2017-12-18 DIAGNOSIS — F419 Anxiety disorder, unspecified: Secondary | ICD-10-CM | POA: Diagnosis not present

## 2017-12-18 DIAGNOSIS — E782 Mixed hyperlipidemia: Secondary | ICD-10-CM | POA: Diagnosis not present

## 2017-12-18 DIAGNOSIS — I10 Essential (primary) hypertension: Secondary | ICD-10-CM | POA: Diagnosis not present

## 2017-12-25 DIAGNOSIS — M546 Pain in thoracic spine: Secondary | ICD-10-CM | POA: Diagnosis not present

## 2017-12-25 DIAGNOSIS — M542 Cervicalgia: Secondary | ICD-10-CM | POA: Diagnosis not present

## 2017-12-25 DIAGNOSIS — M545 Low back pain: Secondary | ICD-10-CM | POA: Diagnosis not present

## 2017-12-25 DIAGNOSIS — M461 Sacroiliitis, not elsewhere classified: Secondary | ICD-10-CM | POA: Diagnosis not present

## 2018-01-22 DIAGNOSIS — M461 Sacroiliitis, not elsewhere classified: Secondary | ICD-10-CM | POA: Diagnosis not present

## 2018-01-22 DIAGNOSIS — M542 Cervicalgia: Secondary | ICD-10-CM | POA: Diagnosis not present

## 2018-01-22 DIAGNOSIS — M546 Pain in thoracic spine: Secondary | ICD-10-CM | POA: Diagnosis not present

## 2018-01-22 DIAGNOSIS — M545 Low back pain: Secondary | ICD-10-CM | POA: Diagnosis not present

## 2018-02-05 DIAGNOSIS — K6289 Other specified diseases of anus and rectum: Secondary | ICD-10-CM | POA: Diagnosis not present

## 2018-02-07 DIAGNOSIS — K6289 Other specified diseases of anus and rectum: Secondary | ICD-10-CM | POA: Diagnosis not present

## 2018-02-12 DIAGNOSIS — K603 Anal fistula: Secondary | ICD-10-CM | POA: Diagnosis not present

## 2018-02-12 DIAGNOSIS — Z6829 Body mass index (BMI) 29.0-29.9, adult: Secondary | ICD-10-CM | POA: Diagnosis not present

## 2018-02-12 DIAGNOSIS — Z9889 Other specified postprocedural states: Secondary | ICD-10-CM | POA: Diagnosis not present

## 2018-02-15 DIAGNOSIS — K603 Anal fistula: Secondary | ICD-10-CM | POA: Diagnosis not present

## 2018-03-19 DIAGNOSIS — M546 Pain in thoracic spine: Secondary | ICD-10-CM | POA: Diagnosis not present

## 2018-03-19 DIAGNOSIS — M461 Sacroiliitis, not elsewhere classified: Secondary | ICD-10-CM | POA: Diagnosis not present

## 2018-03-19 DIAGNOSIS — M542 Cervicalgia: Secondary | ICD-10-CM | POA: Diagnosis not present

## 2018-03-19 DIAGNOSIS — M545 Low back pain: Secondary | ICD-10-CM | POA: Diagnosis not present

## 2018-03-22 DIAGNOSIS — M545 Low back pain: Secondary | ICD-10-CM | POA: Diagnosis not present

## 2018-03-22 DIAGNOSIS — M461 Sacroiliitis, not elsewhere classified: Secondary | ICD-10-CM | POA: Diagnosis not present

## 2018-03-22 DIAGNOSIS — M546 Pain in thoracic spine: Secondary | ICD-10-CM | POA: Diagnosis not present

## 2018-03-22 DIAGNOSIS — M542 Cervicalgia: Secondary | ICD-10-CM | POA: Diagnosis not present

## 2018-03-25 ENCOUNTER — Encounter: Payer: Self-pay | Admitting: Podiatry

## 2018-03-25 ENCOUNTER — Ambulatory Visit (INDEPENDENT_AMBULATORY_CARE_PROVIDER_SITE_OTHER): Payer: BLUE CROSS/BLUE SHIELD | Admitting: Podiatry

## 2018-03-25 DIAGNOSIS — M779 Enthesopathy, unspecified: Secondary | ICD-10-CM

## 2018-03-25 NOTE — Progress Notes (Signed)
Subjective:   Patient ID: Lucas Gentry, male   DOB: 42 y.o.   MRN: [id number]   HPI Patient presents stating his pain in his feet can bother him at different times and he knows he needs new orthotics as they become worn out.  Patient has had surgery several times this year for condition   ROS      Objective:  Physical Exam  Neurovascular status intact with patient found to have moderate flatfoot deformity and discomfort in the plantar arches bilateral secondary to foot structure     Assessment:  Chronic tendinitis with foot structure is big part of this problem     Plan:  Refill reviewed with him at great length foot structural issues and considerations for treatment and recommended orthotics and patient scan for customized orthotics at the current time.  Patient will be seen back to recheck

## 2018-04-15 ENCOUNTER — Ambulatory Visit (INDEPENDENT_AMBULATORY_CARE_PROVIDER_SITE_OTHER): Payer: BLUE CROSS/BLUE SHIELD | Admitting: Orthotics

## 2018-04-15 DIAGNOSIS — M7750 Other enthesopathy of unspecified foot: Secondary | ICD-10-CM

## 2018-04-15 DIAGNOSIS — M779 Enthesopathy, unspecified: Secondary | ICD-10-CM

## 2018-04-15 NOTE — Progress Notes (Signed)
Patient came in today to pick up custom made foot orthotics.  The goals were accomplished and the patient reported no dissatisfaction with said orthotics.  Patient was advised of breakin period and how to report any issues. 

## 2018-04-23 DIAGNOSIS — M545 Low back pain: Secondary | ICD-10-CM | POA: Diagnosis not present

## 2018-04-23 DIAGNOSIS — M461 Sacroiliitis, not elsewhere classified: Secondary | ICD-10-CM | POA: Diagnosis not present

## 2018-04-23 DIAGNOSIS — M546 Pain in thoracic spine: Secondary | ICD-10-CM | POA: Diagnosis not present

## 2018-04-23 DIAGNOSIS — M542 Cervicalgia: Secondary | ICD-10-CM | POA: Diagnosis not present

## 2018-04-26 DIAGNOSIS — M542 Cervicalgia: Secondary | ICD-10-CM | POA: Diagnosis not present

## 2018-04-26 DIAGNOSIS — M461 Sacroiliitis, not elsewhere classified: Secondary | ICD-10-CM | POA: Diagnosis not present

## 2018-04-26 DIAGNOSIS — M546 Pain in thoracic spine: Secondary | ICD-10-CM | POA: Diagnosis not present

## 2018-04-26 DIAGNOSIS — M545 Low back pain: Secondary | ICD-10-CM | POA: Diagnosis not present

## 2018-05-01 DIAGNOSIS — M79676 Pain in unspecified toe(s): Secondary | ICD-10-CM

## 2018-05-28 DIAGNOSIS — M542 Cervicalgia: Secondary | ICD-10-CM | POA: Diagnosis not present

## 2018-05-28 DIAGNOSIS — M545 Low back pain: Secondary | ICD-10-CM | POA: Diagnosis not present

## 2018-05-28 DIAGNOSIS — M546 Pain in thoracic spine: Secondary | ICD-10-CM | POA: Diagnosis not present

## 2018-05-28 DIAGNOSIS — M461 Sacroiliitis, not elsewhere classified: Secondary | ICD-10-CM | POA: Diagnosis not present

## 2018-06-21 DIAGNOSIS — Z131 Encounter for screening for diabetes mellitus: Secondary | ICD-10-CM | POA: Diagnosis not present

## 2018-06-21 DIAGNOSIS — Z1389 Encounter for screening for other disorder: Secondary | ICD-10-CM | POA: Diagnosis not present

## 2018-06-21 DIAGNOSIS — E782 Mixed hyperlipidemia: Secondary | ICD-10-CM | POA: Diagnosis not present

## 2018-06-21 DIAGNOSIS — Z125 Encounter for screening for malignant neoplasm of prostate: Secondary | ICD-10-CM | POA: Diagnosis not present

## 2018-06-21 DIAGNOSIS — Z0000 Encounter for general adult medical examination without abnormal findings: Secondary | ICD-10-CM | POA: Diagnosis not present

## 2018-06-24 DIAGNOSIS — M461 Sacroiliitis, not elsewhere classified: Secondary | ICD-10-CM | POA: Diagnosis not present

## 2018-06-24 DIAGNOSIS — M542 Cervicalgia: Secondary | ICD-10-CM | POA: Diagnosis not present

## 2018-06-24 DIAGNOSIS — M545 Low back pain: Secondary | ICD-10-CM | POA: Diagnosis not present

## 2018-06-24 DIAGNOSIS — M546 Pain in thoracic spine: Secondary | ICD-10-CM | POA: Diagnosis not present

## 2018-07-15 DIAGNOSIS — R51 Headache: Secondary | ICD-10-CM | POA: Diagnosis not present

## 2018-07-15 DIAGNOSIS — I10 Essential (primary) hypertension: Secondary | ICD-10-CM | POA: Diagnosis not present

## 2018-07-15 DIAGNOSIS — M546 Pain in thoracic spine: Secondary | ICD-10-CM | POA: Diagnosis not present

## 2018-07-15 DIAGNOSIS — Z23 Encounter for immunization: Secondary | ICD-10-CM | POA: Diagnosis not present

## 2018-07-15 DIAGNOSIS — M542 Cervicalgia: Secondary | ICD-10-CM | POA: Diagnosis not present

## 2018-07-15 DIAGNOSIS — M461 Sacroiliitis, not elsewhere classified: Secondary | ICD-10-CM | POA: Diagnosis not present

## 2018-07-15 DIAGNOSIS — M545 Low back pain: Secondary | ICD-10-CM | POA: Diagnosis not present

## 2018-07-23 DIAGNOSIS — M546 Pain in thoracic spine: Secondary | ICD-10-CM | POA: Diagnosis not present

## 2018-07-23 DIAGNOSIS — M461 Sacroiliitis, not elsewhere classified: Secondary | ICD-10-CM | POA: Diagnosis not present

## 2018-07-23 DIAGNOSIS — M542 Cervicalgia: Secondary | ICD-10-CM | POA: Diagnosis not present

## 2018-07-23 DIAGNOSIS — M545 Low back pain: Secondary | ICD-10-CM | POA: Diagnosis not present

## 2018-08-20 DIAGNOSIS — M461 Sacroiliitis, not elsewhere classified: Secondary | ICD-10-CM | POA: Diagnosis not present

## 2018-08-20 DIAGNOSIS — M542 Cervicalgia: Secondary | ICD-10-CM | POA: Diagnosis not present

## 2018-08-20 DIAGNOSIS — M545 Low back pain: Secondary | ICD-10-CM | POA: Diagnosis not present

## 2018-08-20 DIAGNOSIS — M546 Pain in thoracic spine: Secondary | ICD-10-CM | POA: Diagnosis not present

## 2018-08-26 DIAGNOSIS — B353 Tinea pedis: Secondary | ICD-10-CM | POA: Diagnosis not present

## 2018-08-26 DIAGNOSIS — D485 Neoplasm of uncertain behavior of skin: Secondary | ICD-10-CM | POA: Diagnosis not present

## 2018-08-26 DIAGNOSIS — L821 Other seborrheic keratosis: Secondary | ICD-10-CM | POA: Diagnosis not present

## 2018-08-26 DIAGNOSIS — L820 Inflamed seborrheic keratosis: Secondary | ICD-10-CM | POA: Diagnosis not present

## 2018-08-26 DIAGNOSIS — D225 Melanocytic nevi of trunk: Secondary | ICD-10-CM | POA: Diagnosis not present

## 2018-08-26 DIAGNOSIS — D1801 Hemangioma of skin and subcutaneous tissue: Secondary | ICD-10-CM | POA: Diagnosis not present

## 2018-08-29 NOTE — Progress Notes (Signed)
Cardiology Office Note   Date:  09/04/2018   ID:  Lucas Gentry, DOB 10/27/1975, MRN [id number]  PCP:  Wenda Low, MD  Cardiologist:  Dr. Johnsie Cancel    No chief complaint on file.     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  First seen January 2017 for likely vagally mediated syncope Negative w/u with normal myovue 01/10/16 and normal Echo EF 55-60% Also had normal event monitor with no arrhythmia  Bought another practice and now manages over 90 million Some stress Drinks most days. Some elevated BP readings He uses xanax sometimes and BP comes down  Moved to [place] so his 6/9  Yo  Kids could go to [employer].     08/20/17 Calcium score again noted to be 0   Having more migraines   Past Medical History:  Diagnosis Date  . History of colon polyps    precancerous  . Hyperlipidemia    takes Atorvastatin daily  . Hypertension    takes Atenolol and Diovan daily  . Seizures ([place])    hx of-as a child.Once tonsils removed no other seizures    Past Surgical History:  Procedure Laterality Date  . COLONOSCOPY    . HERNIA REPAIR Right 2014   inguinal  . LIPOMA EXCISION Right 12/10/2015   Procedure: EXCISION LIPOMA RIGHT UPPER THIGH;  Surgeon: Georganna Skeans, MD;  Location: [facility];  Service: General;  Laterality: Right;  . TONSILLECTOMY       Current Outpatient Medications  Medication Sig Dispense Refill  . ALPRAZolam (XANAX) 0.5 MG tablet Take 0.5 mg by mouth daily as needed for anxiety (flying).    Marland Kitchen aspirin 81 MG tablet Take 81 mg by mouth daily.    Marland Kitchen atorvastatin (LIPITOR) 10 MG tablet Take 10 mg by mouth daily.    Marland Kitchen CALCIUM PO Take 4 tablets by mouth daily.     . IRON PO Take 1 tablet by mouth daily.    Marland Kitchen loratadine (ALLERGY) 10 MG tablet Take 10 mg by mouth daily.    . Multiple Vitamins-Minerals (MULTIVITAL PO) Take 1 tablet by mouth daily.    . Omega-3 Fatty Acids (FISH OIL PO) Take 1 capsule by mouth daily.      Marland Kitchen OVER THE COUNTER MEDICATION Take 1 tablet by mouth daily. Carcumin    . propranolol ER (INDERAL LA) 60 MG 24 hr capsule Take 1 capsule (60 mg total) by mouth daily. 30 capsule 11   No current facility-administered medications for this visit.     Allergies:   Adhesive [tape]    Social History:  The patient  reports that he has never smoked. He has never used smokeless tobacco. He reports that he drinks alcohol. He reports that he does not use drugs.   Family History:  The patient's family history includes Cancer (age of onset: 46) in his paternal grandmother; Heart attack in his maternal grandfather; Hyperlipidemia in his father, mother, paternal grandfather, and paternal grandmother; Hypertension in his maternal grandmother and mother; Stroke in his paternal grandfather.    ROS:  General:no colds or fevers, mild weight changes Skin:no rashes or ulcers HEENT:no blurred vision, no congestion CV:see HPI PUL:see HPI GI:no diarrhea constipation or melena, no indigestion GU:no hematuria, no dysuria MS:no joint pain, no claudication Neuro:no syncope, no lightheadedness Endo:no diabetes, no thyroid disease  Wt Readings from Last 3 Encounters:  09/04/18 227 lb (103 kg)  08/17/17 223 lb 6.4 oz (  101.3 kg)  12/20/16 216 lb 12.8 oz (98.3 kg)     PHYSICAL EXAM: VS:  BP 134/90   Pulse 73   Ht 6' (1.829 m)   Wt 227 lb (103 kg)   SpO2 96%   BMI 30.79 kg/m  , BMI Body mass index is 30.79 kg/m. Affect appropriate Healthy:  appears stated age 47: normal Neck supple with no adenopathy JVP normal no bruits no thyromegaly Lungs clear with no wheezing and good diaphragmatic motion Heart:  S1/S2 no murmur, no rub, gallop or click PMI normal Abdomen: benighn, BS positve, no tenderness, no AAA no bruit.  No HSM or HJR Distal pulses intact with no bruits No edema Neuro non-focal Skin warm and dry No muscular weakness   EKG:   SR  Rate 80 normal 08/25/16 08/17/17 SR early repol   09/04/18 SR rate 75 normal    Recent Labs: No results found for requested labs within last 8760 hours.    Lipid Panel No results found for: CHOL, TRIG, HDL, CHOLHDL, VLDL, LDLCALC, LDLDIRECT     Other studies Reviewed: Additional studies/ records that were reviewed today include: . Echo 12/2015 Study Conclusions  - Left ventricle: The cavity size was mildly dilated. Wall   thickness was normal. Systolic function was normal. The estimated   ejection fraction was in the range of 55% to 60%. Wall motion was   normal; there were no regional wall motion abnormalities. - Aortic valve: There was trivial regurgitation.  Impressions:  - Normal LV systolic function; probable grade 1 diastolic   dysfunction; trace AI, MR and TR.  Nuc study 12/2015 Study Highlights    Nuclear stress EF: 56%.  There was no ST segment deviation noted during stress.  The study is normal.  This is a low risk study.  The left ventricular ejection fraction is normal (55-65%).   Normal resting and stress perfusion EF 56%     ASSESSMENT AND PLAN:  1. Syncope - with associated concussion. No acute findings on CT or MRI - he has no recollection of events leading up to this spell.  No arrhythmia on event monitor and normal myovue  Echo also normal Will need ILR for any recurrence Since last visit no recurrence. Thought to be decreased BP on BB with wt. loss  2. HTN - borderline given migraines will start Inderal LA 60 mg daily   3. HLD - on statin therapy repeat labs with [facility] as triglycerides were high   4. +FH for CAD :  Normal myovue Calcium score 0 08/17/17  5. Abnormal EKG - last ECG normal labile T waves follow - today stable.  6. Migraine:  F/u primary has not seen neurology gets them since childhood about 2-3 x / year Start Inderal LA see above   Current medicines are reviewed with the patient today.  The patient Has no concerns regarding medicines.  The following changes have  been made:  See above Labs/ tests ordered today include:Calcium score   F/U in a year  [name]

## 2018-09-04 ENCOUNTER — Ambulatory Visit (INDEPENDENT_AMBULATORY_CARE_PROVIDER_SITE_OTHER): Payer: BLUE CROSS/BLUE SHIELD | Admitting: Cardiovascular Disease

## 2018-09-04 ENCOUNTER — Encounter: Payer: Self-pay | Admitting: Cardiovascular Disease

## 2018-09-04 VITALS — BP 134/90 | HR 73 | Ht 72.0 in | Wt 227.0 lb

## 2018-09-04 DIAGNOSIS — E782 Mixed hyperlipidemia: Secondary | ICD-10-CM | POA: Diagnosis not present

## 2018-09-04 DIAGNOSIS — I10 Essential (primary) hypertension: Secondary | ICD-10-CM

## 2018-09-04 MED ORDER — PROPRANOLOL HCL ER 60 MG PO CP24: 60.0000 mg | ORAL_CAPSULE | Freq: Every day | ORAL | 11 refills | Status: DC

## 2018-09-04 NOTE — Patient Instructions (Signed)

## 2018-09-04 NOTE — Addendum Note (Signed)
Addended by: Aris Georgia, Quantay Zaremba L on: 09/04/2018 10:43 AM   Modules accepted: Orders

## 2018-09-24 DIAGNOSIS — M461 Sacroiliitis, not elsewhere classified: Secondary | ICD-10-CM | POA: Diagnosis not present

## 2018-09-24 DIAGNOSIS — M542 Cervicalgia: Secondary | ICD-10-CM | POA: Diagnosis not present

## 2018-09-24 DIAGNOSIS — M545 Low back pain: Secondary | ICD-10-CM | POA: Diagnosis not present

## 2018-09-24 DIAGNOSIS — M546 Pain in thoracic spine: Secondary | ICD-10-CM | POA: Diagnosis not present

## 2018-10-07 DIAGNOSIS — M461 Sacroiliitis, not elsewhere classified: Secondary | ICD-10-CM | POA: Diagnosis not present

## 2018-10-07 DIAGNOSIS — M542 Cervicalgia: Secondary | ICD-10-CM | POA: Diagnosis not present

## 2018-10-07 DIAGNOSIS — M545 Low back pain: Secondary | ICD-10-CM | POA: Diagnosis not present

## 2018-10-07 DIAGNOSIS — M546 Pain in thoracic spine: Secondary | ICD-10-CM | POA: Diagnosis not present

## 2018-11-19 DIAGNOSIS — M461 Sacroiliitis, not elsewhere classified: Secondary | ICD-10-CM | POA: Diagnosis not present

## 2018-11-19 DIAGNOSIS — M545 Low back pain: Secondary | ICD-10-CM | POA: Diagnosis not present

## 2018-11-19 DIAGNOSIS — M542 Cervicalgia: Secondary | ICD-10-CM | POA: Diagnosis not present

## 2018-11-19 DIAGNOSIS — M546 Pain in thoracic spine: Secondary | ICD-10-CM | POA: Diagnosis not present

## 2018-11-28 DIAGNOSIS — M545 Low back pain: Secondary | ICD-10-CM | POA: Diagnosis not present

## 2018-11-28 DIAGNOSIS — M546 Pain in thoracic spine: Secondary | ICD-10-CM | POA: Diagnosis not present

## 2018-11-28 DIAGNOSIS — M542 Cervicalgia: Secondary | ICD-10-CM | POA: Diagnosis not present

## 2018-11-28 DIAGNOSIS — M461 Sacroiliitis, not elsewhere classified: Secondary | ICD-10-CM | POA: Diagnosis not present

## 2018-12-06 DIAGNOSIS — M461 Sacroiliitis, not elsewhere classified: Secondary | ICD-10-CM | POA: Diagnosis not present

## 2018-12-06 DIAGNOSIS — M545 Low back pain: Secondary | ICD-10-CM | POA: Diagnosis not present

## 2018-12-06 DIAGNOSIS — M546 Pain in thoracic spine: Secondary | ICD-10-CM | POA: Diagnosis not present

## 2018-12-06 DIAGNOSIS — M542 Cervicalgia: Secondary | ICD-10-CM | POA: Diagnosis not present

## 2018-12-17 DIAGNOSIS — M545 Low back pain: Secondary | ICD-10-CM | POA: Diagnosis not present

## 2018-12-17 DIAGNOSIS — M542 Cervicalgia: Secondary | ICD-10-CM | POA: Diagnosis not present

## 2018-12-17 DIAGNOSIS — M546 Pain in thoracic spine: Secondary | ICD-10-CM | POA: Diagnosis not present

## 2018-12-17 DIAGNOSIS — M461 Sacroiliitis, not elsewhere classified: Secondary | ICD-10-CM | POA: Diagnosis not present

## 2019-09-01 NOTE — Progress Notes (Signed)
Cardiology Office Note   Date:  09/05/2019   ID:  Lucas Gentry, DOB [birth year], MRN [id number]  PCP:  Wenda Low, MD  Cardiologist:  Dr. Johnsie Cancel    No chief complaint on file.     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  First seen January 2017 for likely vagally mediated syncope Negative w/u with normal myovue 01/10/16 and normal Echo EF 55-60% Also had normal event monitor with no arrhythmia  Bought another practice and now manages over 90 million Some stress Drinks most days. Some elevated BP readings He uses xanax sometimes and BP comes down  Moved to [place] so his 6/9  Yo  Kids could go to [employer].     08/20/17 Calcium score again noted to be 0   Migraines better stopped inderal as he thought it contributed to gaining weight Drinks a Coke if he gets headache. 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 History of colon polyps    precancerous  . Hyperlipidemia    takes Atorvastatin daily  . Hypertension    takes Atenolol and Diovan daily  . Seizures ([place])    hx of-as a child.Once tonsils removed no other seizures    Past Surgical History:  Procedure Laterality Date  . COLONOSCOPY    . HERNIA REPAIR Right 2014   inguinal  . LIPOMA EXCISION Right 12/10/2015   Procedure: EXCISION LIPOMA RIGHT UPPER THIGH;  Surgeon: Georganna Skeans, MD;  Location: [facility];  Service: General;  Laterality: Right;  . TONSILLECTOMY       Current Outpatient Medications  Medication Sig Dispense Refill  . ALPRAZolam (XANAX) 0.5 MG tablet Take 0.5 mg by mouth daily as needed for anxiety (flying).    Marland Kitchen aspirin 81 MG tablet Take 81 mg by mouth daily.    Marland Kitchen atorvastatin (LIPITOR) 10 MG tablet Take 10 mg by mouth daily.    Marland Kitchen CALCIUM PO Take 4 tablets by mouth daily.     Marland Kitchen loratadine (ALLERGY) 10 MG tablet Take 10 mg by mouth daily.    . Multiple Vitamins-Minerals (MULTIVITAL  PO) Take 1 tablet by mouth daily.    . Omega-3 Fatty Acids (FISH OIL PO) Take 1 capsule by mouth daily.    Marland Kitchen OVER THE COUNTER MEDICATION Take 1 tablet by mouth daily. Carcumin    . sertraline (ZOLOFT) 25 MG tablet Take 25 mg by mouth daily.     No current facility-administered medications for this visit.     Allergies:   Adhesive [tape]    Social History:  The patient  reports that he has never smoked. He has never used smokeless tobacco. He reports current alcohol use. He reports that he does not use drugs.   Family History:  The patient's family history includes Cancer (age of onset: 51) in his paternal grandmother; Heart attack in his maternal grandfather; Hyperlipidemia in his father, mother, paternal grandfather, and paternal grandmother; Hypertension in his maternal grandmother and mother; Stroke in his paternal grandfather.    ROS:  General:no colds or fevers, mild weight changes Skin:no rashes or ulcers HEENT:no blurred vision, no congestion CV:see HPI PUL:see HPI GI:no diarrhea constipation or melena, no indigestion GU:no hematuria, no dysuria MS:no joint pain, no claudication Neuro:no syncope, no lightheadedness Endo:no diabetes, no thyroid disease  Wt Readings from Last 3 Encounters:  09/05/19 235 lb 12.8 oz (107 kg)  09/04/18 227 lb (103 kg)  08/17/17 223 lb 6.4 oz (101.3 kg)     PHYSICAL EXAM: VS:  BP 128/86   Pulse 80   Ht 6\' 1"  (1.854 m)   Wt 235 lb 12.8 oz (107 kg)   SpO2 98%   BMI 31.11 kg/m  , BMI Body mass index is 31.11 kg/m. Affect appropriate Healthy:  appears stated age 43: normal Neck supple with no adenopathy JVP normal no bruits no thyromegaly Lungs clear with no wheezing and good diaphragmatic motion Heart:  S1/S2 no murmur, no rub, gallop or click PMI normal Abdomen: benighn, BS positve, no tenderness, no AAA no bruit.  No HSM or HJR Distal pulses intact with no bruits No edema Neuro non-focal Skin warm and dry No muscular  weakness   EKG:   09/05/19 SR rate 80 normal no V6 lead    Recent Labs: No results found for requested labs within last 8760 hours.    Lipid Panel No results found for: CHOL, TRIG, HDL, CHOLHDL, VLDL, LDLCALC, LDLDIRECT     Other studies Reviewed: Additional studies/ records that were reviewed today include: . Echo 12/2015 Study Conclusions  - Left ventricle: The cavity size was mildly dilated. Wall   thickness was normal. Systolic function was normal. The estimated   ejection fraction was in the range of 55% to 60%. Wall motion was   normal; there were no regional wall motion abnormalities. - Aortic valve: There was trivial regurgitation.  Impressions:  - Normal LV systolic function; probable grade 1 diastolic   dysfunction; trace AI, MR and TR.  Nuc study 12/2015 Study Highlights    Nuclear stress EF: 56%.  There was no ST segment deviation noted during stress.  The study is normal.  This is a low risk study.  The left ventricular ejection fraction is normal (55-65%).   Normal resting and stress perfusion EF 56%     ASSESSMENT AND PLAN:  1. Syncope - with associated concussion. No acute findings on CT or MRI - he has no recollection of events leading up to this spell.  No arrhythmia on event monitor and normal myovue  Echo also normal Will need ILR for any recurrence Since last visit no recurrence. Thought to be decreased BP on BB with wt. loss  2. HTN - Well controlled.  Continue current medications and low sodium Dash type diet.     3. HLD - on statin therapy repeat labs with [facility] as triglycerides were high   4. +FH for CAD :  Normal myovue Calcium score 0 08/17/17  5. Abnormal EKG - last ECG normal labile T waves follow - normal today   6. Migraine:  F/u primary has not seen neurology gets them since childhood about 2-3 x / year Can use Inderal PRN   Current medicines are reviewed with the patient today.  The patient Has no concerns  regarding medicines.  The following changes have been made:  See above Labs/ tests ordered today  None   F/U in a year likely repeat calcium score then   [name]

## 2019-09-05 ENCOUNTER — Encounter: Payer: Self-pay | Admitting: Cardiovascular Disease

## 2019-09-05 ENCOUNTER — Other Ambulatory Visit: Payer: Self-pay

## 2019-09-05 ENCOUNTER — Ambulatory Visit (INDEPENDENT_AMBULATORY_CARE_PROVIDER_SITE_OTHER): Payer: BLUE CROSS/BLUE SHIELD | Admitting: Cardiovascular Disease

## 2019-09-05 VITALS — BP 128/86 | HR 80 | Ht 73.0 in | Wt 235.8 lb

## 2019-09-05 DIAGNOSIS — I10 Essential (primary) hypertension: Secondary | ICD-10-CM

## 2019-09-05 DIAGNOSIS — E785 Hyperlipidemia, unspecified: Secondary | ICD-10-CM

## 2019-09-05 NOTE — Patient Instructions (Signed)
Medication Instructions:   *If you need a refill on your cardiac medications before your next appointment, please call your pharmacy*  Lab Work:  If you have labs (blood work) drawn today and your tests are completely normal, you will receive your results only by: Marland Kitchen MyChart Message (if you have MyChart) OR . A paper copy in the mail If you have any lab test that is abnormal or we need to change your treatment, we will call you to review the results.  Testing/Procedures: None ordered today  Follow-Up: At [facility], you and your health needs are our priority.  As part of our continuing mission to provide you with exceptional heart care, we have created designated Provider Care Teams.  These Care Teams include your primary Cardiologist (physician) and Advanced Practice Providers (APPs -  Physician Assistants and Nurse Practitioners) who all work together to provide you with the care you need, when you need it.  Your next appointment:   1 year(s)  The format for your next appointment:   In Person  Provider:   You may see [name], MD or one of the following Advanced Practice Providers on your designated Care Team:    Truitt Merle, NP  Cecilie Kicks, NP  Kathyrn Drown, NP

## 2019-09-24 DIAGNOSIS — Z1389 Encounter for screening for other disorder: Secondary | ICD-10-CM | POA: Diagnosis not present

## 2019-09-24 DIAGNOSIS — E782 Mixed hyperlipidemia: Secondary | ICD-10-CM | POA: Diagnosis not present

## 2019-09-24 DIAGNOSIS — Z131 Encounter for screening for diabetes mellitus: Secondary | ICD-10-CM | POA: Diagnosis not present

## 2019-09-24 DIAGNOSIS — I10 Essential (primary) hypertension: Secondary | ICD-10-CM | POA: Diagnosis not present

## 2019-09-24 DIAGNOSIS — Z125 Encounter for screening for malignant neoplasm of prostate: Secondary | ICD-10-CM | POA: Diagnosis not present

## 2019-09-24 DIAGNOSIS — Z0000 Encounter for general adult medical examination without abnormal findings: Secondary | ICD-10-CM | POA: Diagnosis not present

## 2019-12-08 ENCOUNTER — Telehealth: Payer: Self-pay

## 2019-12-08 NOTE — Telephone Encounter (Signed)
I spoke to the patient who is presently experiencing hypertension (157/105) with "severe" headache and "stiff neck".  He also feels nauseous, but denies CP or SOB.  With the described symptoms, I advised him to go to the ED for further evaluation.    He said that he took Losartan 25 mg, which he has been off of for awhile and was wondering if he could take another.  I advised against it.  He verbalized understanding.

## 2020-11-13 HISTORY — PX: SHOULDER ARTHROSCOPY WITH ROTATOR CUFF REPAIR AND OPEN BICEPS TENODESIS: SHX6677

## 2020-11-23 HISTORY — PX: SHOULDER ARTHROSCOPY WITH ROTATOR CUFF REPAIR: SHX5685

## 2021-10-25 ENCOUNTER — Telehealth: Payer: Self-pay | Admitting: Cardiovascular Disease

## 2021-10-25 DIAGNOSIS — M25612 Stiffness of left shoulder, not elsewhere classified: Secondary | ICD-10-CM | POA: Diagnosis not present

## 2021-10-25 DIAGNOSIS — M25511 Pain in right shoulder: Secondary | ICD-10-CM | POA: Diagnosis not present

## 2021-10-25 DIAGNOSIS — M25611 Stiffness of right shoulder, not elsewhere classified: Secondary | ICD-10-CM | POA: Diagnosis not present

## 2021-10-25 DIAGNOSIS — M25512 Pain in left shoulder: Secondary | ICD-10-CM | POA: Diagnosis not present

## 2021-10-25 DIAGNOSIS — R29898 Other symptoms and signs involving the musculoskeletal system: Secondary | ICD-10-CM | POA: Diagnosis not present

## 2021-10-25 DIAGNOSIS — Z9889 Other specified postprocedural states: Secondary | ICD-10-CM | POA: Diagnosis not present

## 2021-10-25 NOTE — Telephone Encounter (Signed)
Pt is requesting a Arboriculturist from [place] to [place]

## 2021-10-27 DIAGNOSIS — R29898 Other symptoms and signs involving the musculoskeletal system: Secondary | ICD-10-CM | POA: Diagnosis not present

## 2021-10-27 DIAGNOSIS — M25612 Stiffness of left shoulder, not elsewhere classified: Secondary | ICD-10-CM | POA: Diagnosis not present

## 2021-10-27 DIAGNOSIS — M25512 Pain in left shoulder: Secondary | ICD-10-CM | POA: Diagnosis not present

## 2021-10-27 NOTE — Telephone Encounter (Signed)
This is probably because I did his father's heart cath.  Is fine with me  Glenetta Hew, MD

## 2021-11-04 DIAGNOSIS — Z9889 Other specified postprocedural states: Secondary | ICD-10-CM | POA: Diagnosis not present

## 2021-11-04 DIAGNOSIS — M25512 Pain in left shoulder: Secondary | ICD-10-CM | POA: Diagnosis not present

## 2021-11-04 DIAGNOSIS — M25612 Stiffness of left shoulder, not elsewhere classified: Secondary | ICD-10-CM | POA: Diagnosis not present

## 2021-11-04 DIAGNOSIS — M25511 Pain in right shoulder: Secondary | ICD-10-CM | POA: Diagnosis not present

## 2021-11-04 DIAGNOSIS — R29898 Other symptoms and signs involving the musculoskeletal system: Secondary | ICD-10-CM | POA: Diagnosis not present

## 2021-11-04 DIAGNOSIS — M25611 Stiffness of right shoulder, not elsewhere classified: Secondary | ICD-10-CM | POA: Diagnosis not present

## 2021-11-11 DIAGNOSIS — M25612 Stiffness of left shoulder, not elsewhere classified: Secondary | ICD-10-CM | POA: Diagnosis not present

## 2021-11-11 DIAGNOSIS — Z9889 Other specified postprocedural states: Secondary | ICD-10-CM | POA: Diagnosis not present

## 2021-11-11 DIAGNOSIS — M25512 Pain in left shoulder: Secondary | ICD-10-CM | POA: Diagnosis not present

## 2021-11-11 DIAGNOSIS — R29898 Other symptoms and signs involving the musculoskeletal system: Secondary | ICD-10-CM | POA: Diagnosis not present

## 2021-11-11 DIAGNOSIS — M25611 Stiffness of right shoulder, not elsewhere classified: Secondary | ICD-10-CM | POA: Diagnosis not present

## 2021-11-11 DIAGNOSIS — M25511 Pain in right shoulder: Secondary | ICD-10-CM | POA: Diagnosis not present

## 2021-11-14 DIAGNOSIS — M25612 Stiffness of left shoulder, not elsewhere classified: Secondary | ICD-10-CM | POA: Diagnosis not present

## 2021-11-14 DIAGNOSIS — M25511 Pain in right shoulder: Secondary | ICD-10-CM | POA: Diagnosis not present

## 2021-11-14 DIAGNOSIS — M25611 Stiffness of right shoulder, not elsewhere classified: Secondary | ICD-10-CM | POA: Diagnosis not present

## 2021-11-14 DIAGNOSIS — M25512 Pain in left shoulder: Secondary | ICD-10-CM | POA: Diagnosis not present

## 2021-11-14 DIAGNOSIS — Z9889 Other specified postprocedural states: Secondary | ICD-10-CM | POA: Diagnosis not present

## 2021-11-14 DIAGNOSIS — R29898 Other symptoms and signs involving the musculoskeletal system: Secondary | ICD-10-CM | POA: Diagnosis not present

## 2021-11-17 DIAGNOSIS — Z9889 Other specified postprocedural states: Secondary | ICD-10-CM | POA: Diagnosis not present

## 2021-11-17 DIAGNOSIS — M25512 Pain in left shoulder: Secondary | ICD-10-CM | POA: Diagnosis not present

## 2021-11-17 DIAGNOSIS — M25612 Stiffness of left shoulder, not elsewhere classified: Secondary | ICD-10-CM | POA: Diagnosis not present

## 2021-11-17 DIAGNOSIS — R29898 Other symptoms and signs involving the musculoskeletal system: Secondary | ICD-10-CM | POA: Diagnosis not present

## 2021-11-22 DIAGNOSIS — M25612 Stiffness of left shoulder, not elsewhere classified: Secondary | ICD-10-CM | POA: Diagnosis not present

## 2021-11-22 DIAGNOSIS — M25512 Pain in left shoulder: Secondary | ICD-10-CM | POA: Diagnosis not present

## 2021-11-22 DIAGNOSIS — Z9889 Other specified postprocedural states: Secondary | ICD-10-CM | POA: Diagnosis not present

## 2021-11-22 DIAGNOSIS — R29898 Other symptoms and signs involving the musculoskeletal system: Secondary | ICD-10-CM | POA: Diagnosis not present

## 2021-11-24 DIAGNOSIS — R29898 Other symptoms and signs involving the musculoskeletal system: Secondary | ICD-10-CM | POA: Diagnosis not present

## 2021-11-24 DIAGNOSIS — M25512 Pain in left shoulder: Secondary | ICD-10-CM | POA: Diagnosis not present

## 2021-11-24 DIAGNOSIS — M25511 Pain in right shoulder: Secondary | ICD-10-CM | POA: Diagnosis not present

## 2021-11-24 DIAGNOSIS — Z9889 Other specified postprocedural states: Secondary | ICD-10-CM | POA: Diagnosis not present

## 2021-11-24 DIAGNOSIS — M25612 Stiffness of left shoulder, not elsewhere classified: Secondary | ICD-10-CM | POA: Diagnosis not present

## 2021-11-24 DIAGNOSIS — M25611 Stiffness of right shoulder, not elsewhere classified: Secondary | ICD-10-CM | POA: Diagnosis not present

## 2021-11-29 DIAGNOSIS — M25512 Pain in left shoulder: Secondary | ICD-10-CM | POA: Diagnosis not present

## 2021-11-29 DIAGNOSIS — M25612 Stiffness of left shoulder, not elsewhere classified: Secondary | ICD-10-CM | POA: Diagnosis not present

## 2021-11-29 DIAGNOSIS — Z9889 Other specified postprocedural states: Secondary | ICD-10-CM | POA: Diagnosis not present

## 2021-11-29 DIAGNOSIS — R29898 Other symptoms and signs involving the musculoskeletal system: Secondary | ICD-10-CM | POA: Diagnosis not present

## 2021-12-01 DIAGNOSIS — M25612 Stiffness of left shoulder, not elsewhere classified: Secondary | ICD-10-CM | POA: Diagnosis not present

## 2021-12-06 DIAGNOSIS — M25612 Stiffness of left shoulder, not elsewhere classified: Secondary | ICD-10-CM | POA: Diagnosis not present

## 2021-12-06 DIAGNOSIS — M25512 Pain in left shoulder: Secondary | ICD-10-CM | POA: Diagnosis not present

## 2021-12-06 DIAGNOSIS — R29898 Other symptoms and signs involving the musculoskeletal system: Secondary | ICD-10-CM | POA: Diagnosis not present

## 2021-12-06 DIAGNOSIS — Z9889 Other specified postprocedural states: Secondary | ICD-10-CM | POA: Diagnosis not present

## 2021-12-08 DIAGNOSIS — M25512 Pain in left shoulder: Secondary | ICD-10-CM | POA: Diagnosis not present

## 2021-12-08 DIAGNOSIS — M25612 Stiffness of left shoulder, not elsewhere classified: Secondary | ICD-10-CM | POA: Diagnosis not present

## 2021-12-08 DIAGNOSIS — Z9889 Other specified postprocedural states: Secondary | ICD-10-CM | POA: Diagnosis not present

## 2021-12-08 DIAGNOSIS — R29898 Other symptoms and signs involving the musculoskeletal system: Secondary | ICD-10-CM | POA: Diagnosis not present

## 2021-12-15 DIAGNOSIS — M25512 Pain in left shoulder: Secondary | ICD-10-CM | POA: Diagnosis not present

## 2021-12-15 DIAGNOSIS — R29898 Other symptoms and signs involving the musculoskeletal system: Secondary | ICD-10-CM | POA: Diagnosis not present

## 2021-12-15 DIAGNOSIS — Z9889 Other specified postprocedural states: Secondary | ICD-10-CM | POA: Diagnosis not present

## 2021-12-15 DIAGNOSIS — M25611 Stiffness of right shoulder, not elsewhere classified: Secondary | ICD-10-CM | POA: Diagnosis not present

## 2021-12-15 DIAGNOSIS — M25511 Pain in right shoulder: Secondary | ICD-10-CM | POA: Diagnosis not present

## 2021-12-15 DIAGNOSIS — M25612 Stiffness of left shoulder, not elsewhere classified: Secondary | ICD-10-CM | POA: Diagnosis not present

## 2021-12-16 DIAGNOSIS — M25611 Stiffness of right shoulder, not elsewhere classified: Secondary | ICD-10-CM | POA: Diagnosis not present

## 2021-12-16 DIAGNOSIS — M25511 Pain in right shoulder: Secondary | ICD-10-CM | POA: Diagnosis not present

## 2021-12-16 DIAGNOSIS — R29898 Other symptoms and signs involving the musculoskeletal system: Secondary | ICD-10-CM | POA: Diagnosis not present

## 2021-12-16 DIAGNOSIS — M25512 Pain in left shoulder: Secondary | ICD-10-CM | POA: Diagnosis not present

## 2021-12-16 DIAGNOSIS — M25612 Stiffness of left shoulder, not elsewhere classified: Secondary | ICD-10-CM | POA: Diagnosis not present

## 2021-12-16 DIAGNOSIS — Z9889 Other specified postprocedural states: Secondary | ICD-10-CM | POA: Diagnosis not present

## 2021-12-20 DIAGNOSIS — M25611 Stiffness of right shoulder, not elsewhere classified: Secondary | ICD-10-CM | POA: Diagnosis not present

## 2021-12-20 DIAGNOSIS — M25511 Pain in right shoulder: Secondary | ICD-10-CM | POA: Diagnosis not present

## 2021-12-20 DIAGNOSIS — M25612 Stiffness of left shoulder, not elsewhere classified: Secondary | ICD-10-CM | POA: Diagnosis not present

## 2021-12-20 DIAGNOSIS — M25512 Pain in left shoulder: Secondary | ICD-10-CM | POA: Diagnosis not present

## 2021-12-20 DIAGNOSIS — Z9889 Other specified postprocedural states: Secondary | ICD-10-CM | POA: Diagnosis not present

## 2021-12-20 DIAGNOSIS — R29898 Other symptoms and signs involving the musculoskeletal system: Secondary | ICD-10-CM | POA: Diagnosis not present

## 2022-01-03 DIAGNOSIS — R29898 Other symptoms and signs involving the musculoskeletal system: Secondary | ICD-10-CM | POA: Diagnosis not present

## 2022-01-03 DIAGNOSIS — M25512 Pain in left shoulder: Secondary | ICD-10-CM | POA: Diagnosis not present

## 2022-01-03 DIAGNOSIS — M25612 Stiffness of left shoulder, not elsewhere classified: Secondary | ICD-10-CM | POA: Diagnosis not present

## 2022-01-03 DIAGNOSIS — Z9889 Other specified postprocedural states: Secondary | ICD-10-CM | POA: Diagnosis not present

## 2022-01-03 DIAGNOSIS — M25511 Pain in right shoulder: Secondary | ICD-10-CM | POA: Diagnosis not present

## 2022-01-13 DIAGNOSIS — M25611 Stiffness of right shoulder, not elsewhere classified: Secondary | ICD-10-CM | POA: Diagnosis not present

## 2022-01-13 DIAGNOSIS — M25512 Pain in left shoulder: Secondary | ICD-10-CM | POA: Diagnosis not present

## 2022-01-13 DIAGNOSIS — R29898 Other symptoms and signs involving the musculoskeletal system: Secondary | ICD-10-CM | POA: Diagnosis not present

## 2022-01-13 DIAGNOSIS — M25612 Stiffness of left shoulder, not elsewhere classified: Secondary | ICD-10-CM | POA: Diagnosis not present

## 2022-01-13 DIAGNOSIS — Z9889 Other specified postprocedural states: Secondary | ICD-10-CM | POA: Diagnosis not present

## 2022-01-13 DIAGNOSIS — M25511 Pain in right shoulder: Secondary | ICD-10-CM | POA: Diagnosis not present

## 2022-01-18 DIAGNOSIS — M1711 Unilateral primary osteoarthritis, right knee: Secondary | ICD-10-CM | POA: Diagnosis not present

## 2022-01-30 ENCOUNTER — Ambulatory Visit: Payer: BLUE CROSS/BLUE SHIELD | Admitting: Cardiology

## 2022-01-30 ENCOUNTER — Telehealth: Payer: Self-pay | Admitting: *Deleted

## 2022-01-30 ENCOUNTER — Encounter: Payer: Self-pay | Admitting: Cardiology

## 2022-01-30 NOTE — Telephone Encounter (Signed)
Called left message to call office. RN to discuss changing appointment to 02/01/22 from 01/30/22 ?

## 2022-01-30 NOTE — Progress Notes (Deleted)
? ?Primary Care Provider: Wenda Low, MD ?[facility] Cardiologist: [name], MD (Last seen Nov 2020) ?-- > Seen by Dr. Lazarus Gowda 01/2020 & 01/2021 ?Electrophysiologist: None ? ?Clinic Note: ?No chief complaint on file. ? ?=================================== ? ?ASSESSMENT/PLAN  ? ?Problem List Items Addressed This Visit   ?None ? ? ?=================================== ? ?HPI:   ? ?Lucas Gentry is a 46 y.o. male with PMH notable for HTN and HLD as well as family history of CAD who is being seen today for the transfer cardiology care.  He is seen today at the request of Wenda Low, MD. ? ?Lucas Gentry was last seen on January 25, 2021 by Dr. Salvadore Oxford as a 1 year follow-up having been seen in initial consultation in April 2021.  Prior to that he had been seen by Dr. [name] [facility] in [facility].  He had had rotator cuff surgery, but no cardiac issues.  Had excellent results with lipid management on Lipitor.  Was golfing and walking regularly for aerobic exercise.  Denied any chest pain/pressure or dyspnea on exertion.  No CHF symptoms. ?-> Continued exercise program.  Continued atorvastatin 10 mg daily, Hyzaar 50/12.5 mg daily ? ?Recent Hospitalizations: None ? ?Reviewed  CV studies:   ? ?The following studies were reviewed today: (if available, images/films reviewed: From Epic Chart or Care Everywhere) ?***: ? ? ?Interval History:  ? ?Lucas Gentry  ? ?CV Review of Symptoms (Summary) ?Cardiovascular ROS: {roscv:310661} ? ?REVIEWED OF SYSTEMS  ? ?ROS ? ?I have reviewed and (if needed) personally updated the patient's problem list, medications, allergies, past medical and surgical history, social and family history.  ? ?PAST MEDICAL HISTORY  ? ?Past Medical History:  ?Diagnosis Date  ? History of colon polyps   ? precancerous  ? Hyperlipidemia   ? takes Atorvastatin daily  ? Hypertension   ? takes Atenolol and Diovan daily  ? Seizures ([place])   ? hx of-as a child.Once tonsils  removed no other seizures  ? ? ?PAST SURGICAL HISTORY  ? ?Past Surgical History:  ?Procedure Laterality Date  ? COLONOSCOPY    ? HERNIA REPAIR Right 10/16/2012  ? inguinal  ? LIPOMA EXCISION Right 12/10/2015  ? Procedure: EXCISION LIPOMA RIGHT UPPER THIGH;  Surgeon: Georganna Skeans, MD;  Location: [facility];  Service: General;  Laterality: Right;  ? SHOULDER ARTHROSCOPY WITH ROTATOR CUFF REPAIR Right 11/23/2020  ? Procedure: SHOULDER ARTHROSCOPY W/ ROTATOR CUFF REPAIR, BICEPS TENODESIS, EXTENSIVE DEBRIDEMENT; Surgeon: Martinique Miller Case, MD; Location: [facility]; Service: Orthopedics; Laterality: Right;  ? TONSILLECTOMY    ? ? ?Immunization History  ?Administered Date(s) Administered  ? Influenza Split 07/16/2014  ? [place] (J&J) SARS-COV-2 Vaccination 12/27/2019  ? ? ?MEDICATIONS/ALLERGIES  ? ?No outpatient medications have been marked as taking for the 01/30/22 encounter (Appointment) with Leonie Man, MD.  ? ? ?Allergies  ?Allergen Reactions  ? Adhesive [Tape] Rash and Other (See Comments)  ?  55M bandaids glues makes blisters  ? ? ?SOCIAL HISTORY/FAMILY HISTORY  ? ?Reviewed in Epic:  ? ?Social History  ? ?Tobacco Use  ? Smoking status: Never  ? Smokeless tobacco: Never  ? Tobacco comments:  ?  cigar once a year   ?Substance Use Topics  ? Alcohol use: Yes  ?  Alcohol/week: 0.0 standard drinks  ?  Comment: occasionaly  ? Drug use: No  ? ?Social History  ? ?Social History Narrative  ? Not on file  ? ?Married; works at  [employer] ?Exercises on average 3 days a week for 20 minutes at a time. ? ?Family History  ?Problem Relation Age of Onset  ? Hypertension Mother   ? Hyperlipidemia Mother   ? Hyperlipidemia Father   ? Hypertension Maternal Grandmother   ? Heart attack Maternal Grandfather   ? Hyperlipidemia Paternal Grandmother   ? Cancer Paternal Grandmother 43  ?     ? type  ? Hyperlipidemia Paternal Grandfather   ? Stroke Paternal Grandfather   ? ? ?OBJCTIVE -PE, EKG, labs  ? ?Wt Readings from Last 3  Encounters:  ?09/05/19 235 lb 12.8 oz (107 kg)  ?09/04/18 227 lb (103 kg)  ?08/17/17 223 lb 6.4 oz (101.3 kg)  ? ? ?Physical Exam: ?There were no vitals taken for this visit. ?Physical Exam ? ? Adult ECG Report ? Rate: *** ;  Rhythm: {rhythm:17366};  ? Narrative Interpretation: *** ? ?Recent Labs:  ***  ?No results found for: CHOL, HDL, LDLCALC, LDLDIRECT, TRIG, CHOLHDL ?Lab Results  ?Component Value Date  ? CREATININE 1.05 12/31/2015  ? BUN 12 12/31/2015  ? NA 143 12/31/2015  ? K 3.7 12/31/2015  ? CL 111 12/31/2015  ? CO2 23 12/31/2015  ? ? ?  Latest Ref Rng & Units 12/31/2015  ?  3:30 AM 12/30/2015  ?  9:25 AM 12/08/2015  ?  1:07 PM  ?CBC  ?WBC 4.0 - 10.5 K/uL 8.7   10.7   8.2    ?Hemoglobin 13.0 - 17.0 g/dL 12.8   15.1   14.6    ?Hematocrit 39.0 - 52.0 % 40.4   45.2   45.2    ?Platelets 150 - 400 K/uL 259   265   234    ? ? ?No results found for: HGBA1C ?Lab Results  ?Component Value Date  ? TSH 0.89 09/25/2014  ? ? ?================================================== ? ?COVID-19 Education: ?The signs and symptoms of COVID-19 were discussed with the patient and how to seek care for testing (follow up with PCP or arrange E-visit).   ? ?I spent a total of *** minutes with the patient spent in direct patient consultation.  ?Additional time spent with chart review  / charting (studies, outside notes, etc): *** min ?Total Time: *** min ? ?Current medicines are reviewed at length with the patient today.  (+/- concerns) *** ? ?This visit occurred during the SARS-CoV-2 public health emergency.  Safety protocols were in place, including screening questions prior to the visit, additional usage of staff PPE, and extensive cleaning of exam room while observing appropriate contact time as indicated for disinfecting solutions. ? ?Notice: This dictation was prepared with Dragon dictation along with smart phrase technology. Any transcriptional errors that result from this process are unintentional and may not be corrected upon  review. ? ? ?Studies Ordered:  ?No orders of the defined types were placed in this encounter. ? ? ?Patient Instructions / Medication Changes & Studies & Tests Ordered  ? ?There are no Patient Instructions on file for this visit. ? ? ? ?Glenetta Hew, M.D., M.S. ?Interventional Cardiologist  ? ?Pager # [phone] ?Phone # [phone] ?[address] ?[place], [place] [address] ? ? ?Thank you for choosing Heartcare at [facility]!! ?  ? ?

## 2022-02-01 ENCOUNTER — Encounter: Payer: Self-pay | Admitting: Cardiology

## 2022-02-01 ENCOUNTER — Ambulatory Visit (INDEPENDENT_AMBULATORY_CARE_PROVIDER_SITE_OTHER): Payer: 59 | Admitting: Cardiology

## 2022-02-01 VITALS — BP 146/76 | HR 89 | Ht 72.0 in | Wt 239.8 lb

## 2022-02-01 DIAGNOSIS — E785 Hyperlipidemia, unspecified: Secondary | ICD-10-CM | POA: Diagnosis not present

## 2022-02-01 DIAGNOSIS — R55 Syncope and collapse: Secondary | ICD-10-CM | POA: Diagnosis not present

## 2022-02-01 DIAGNOSIS — I10 Essential (primary) hypertension: Secondary | ICD-10-CM

## 2022-02-01 DIAGNOSIS — Z8249 Family history of ischemic heart disease and other diseases of the circulatory system: Secondary | ICD-10-CM | POA: Diagnosis not present

## 2022-02-01 DIAGNOSIS — R053 Chronic cough: Secondary | ICD-10-CM | POA: Diagnosis not present

## 2022-02-01 NOTE — Patient Instructions (Addendum)
Medication Instructions:  ? ?No changes ?*If you need a refill on your cardiac medications before your next appointment, please call your pharmacy* ? ? ?Lab Work: ?Not needed ? ? ?Testing/Procedures: ? CT coronary calcium score.  ? ?Test locations:  ?[facility] ([address] [place], [place] [address]) ?[facility] ([address] [place], [place] [address])  ? ?This is $99 out of pocket. ? ? ?Coronary CalciumScan ?A coronary calcium scan is an imaging test used to look for deposits of calcium and other fatty materials (plaques) in the inner lining of the blood vessels of the heart (coronary arteries). These deposits of calcium and plaques can partly clog and narrow the coronary arteries without producing any symptoms or warning signs. This puts a person at risk for a heart attack. This test can detect these deposits before symptoms develop. ?Tell a health care provider about: ?Any allergies you have. ?All medicines you are taking, including vitamins, herbs, eye drops, creams, and over-the-counter medicines. ?Any problems you or family members have had with anesthetic medicines. ?Any blood disorders you have. ?Any surgeries you have had. ?Any medical conditions you have. ?Whether you are pregnant or may be pregnant. ?What are the risks? ?Generally, this is a safe procedure. However, problems may occur, including: ?Harm to a pregnant woman and her unborn baby. This test involves the use of radiation. Radiation exposure can be dangerous to a pregnant woman and her unborn baby. If you are pregnant, you generally should not have this procedure done. ?Slight increase in the risk of cancer. This is because of the radiation involved in the test. ?What happens before the procedure? ?No preparation is needed for this procedure. ?What happens during the procedure? ?You will undress and remove any jewelry around your neck or chest. ?You will put on a hospital gown. ?Sticky electrodes will be placed on your  chest. The electrodes will be connected to an electrocardiogram (ECG) machine to record a tracing of the electrical activity of your heart. ?A CT scanner will take pictures of your heart. During this time, you will be asked to lie still and hold your breath for 2-3 seconds while a picture of your heart is being taken. ?The procedure may vary among health care providers and hospitals. ?What happens after the procedure? ?You can get dressed. ?You can return to your normal activities. ?It is up to you to get the results of your test. Ask your health care provider, or the department that is doing the test, when your results will be ready. ?Summary ?A coronary calcium scan is an imaging test used to look for deposits of calcium and other fatty materials (plaques) in the inner lining of the blood vessels of the heart (coronary arteries). ?Generally, this is a safe procedure. Tell your health care provider if you are pregnant or may be pregnant. ?No preparation is needed for this procedure. ?A CT scanner will take pictures of your heart. ?You can return to your normal activities after the scan is done. ?This information is not intended to replace advice given to you by your health care provider. Make sure you discuss any questions you have with your health care provider. ?Document Released: 03/30/2008 Document Revised: 08/21/2016 Document Reviewed: 08/21/2016 ?Elsevier Interactive Patient Education ? 2017 [employer] ? ? ?Follow-Up: ?At [facility], you and your health needs are our priority.  As part of our continuing mission to provide you with exceptional heart care, we have created designated Provider Care Teams.  These Care Teams  include your primary Cardiologist (physician) and Advanced Practice Providers (APPs -  Physician Assistants and Nurse Practitioners) who all work together to provide you with the care you need, when you need it. ? ?  ? ?Your next appointment:   ?1 month(s) ? ?The format for your next  appointment:   ?In Person ? ?Provider:   ?Glenetta Hew, MD  ? ? ?Other Instructions  ?

## 2022-02-01 NOTE — Progress Notes (Signed)
? ? ?Primary Care Provider: Wenda Low, MD ?[facility] Cardiologist: Glenetta Hew, MD ?Electrophysiologist: None ? ?Clinic Note: ?No chief complaint on file. ? ? ?=================================== ? ?ASSESSMENT/PLAN  ? ?Problem List Items Addressed This Visit   ? ?  ? Cardiology Problems  ? Essential hypertension (Chronic)  ?  Blood pressure is little higher today, but usually at home is much better.  He is actually on losartan-HCTZ 50/12.5 mg daily. ? ?8 years ago was changed from ACE inhibitor to ARB by Dr. [name] because of concerns for possible pulmonary complications.  He had a nagging cough back then.  Still having a nagging cough. => If this continues we can consider switching from losartan to a different ARB with less cross-reactivity to ACE inhibitors. ? ?  ?  ? Relevant Orders  ? EKG 12-Lead (Completed)  ? CT CARDIAC SCORING (SELF PAY ONLY)  ? Hyperlipidemia with target LDL less than 100 (Chronic)  ?  Most recent LDL cholesterol 96.  Seems to tolerating atorvastatin 20 mg daily.  Most recent check was this month.  Even though he has significant family history is Coronary Calcium Score 0, therefore would not be more aggressive, especially with his musculoskeletal issues. ? ?He is pretty active and always on the go, but probably has not been as good as he should be with his dietary modification.  Discussed dietary adjustments. ? ?Continue to try to get aerobic exercise ? ?  ?  ?  ? Other  ? Family history of early CAD - Primary (Chronic)  ?  Pretty significant family history of CAD on his mother side pretty much all the males, but now his father has had MI albeit at age 70.  He had outside where he does not want to be sure that he does not follow-up with the same category. ? ?He had a negative ETT back in 2015 and then the subsequently had Coronary Calcium Score's of 0 in 2015 and 2018.  Subsequently had a nonischemic Myoview in 2017 ? ?He denies any real chest pain symptoms just some right-sided  symptoms related to his rotator cuff tear.  Is also had left-sided rotator cuff tear so he has this neck tightness that will sometimes make his blood pressure go up.  Has not had any true anginal symptoms. ? ?He still wants to make sure that he is stable, therefore we will recheck Coronary Calcium Score. ? ?We will also continue current dose of statin with his LDL being 96 on last check. ? ?  ?  ? Relevant Orders  ? EKG 12-Lead (Completed)  ? CT CARDIAC SCORING (SELF PAY ONLY)  ? Cough (Chronic)  ?  He was changed from ACE inhibitor to ARB, but was initially valsartan, now he is on losartan.  Can reassess if cough continues and may be switch from losartan to a different ARB with less likelihood of having proximal activity with ACE inhibitors. ? ?Valsartan, irbesartan, telmisartan are olmesartan are all options.  All of them have combination options with HCTZ. ? ?  ?  ? Syncope (Chronic)  ?  No recurrent episodes.  He is very careful to try to maintain adequate hydration. ? ?  ?  ? ? ?=================================== ? ?HPI:   ? ?Lucas Gentry is a 46 y.o. male with a history of HTN, HLD, family history of CAD with his father having an MI recently at age 68 (and just about every male in his mother side having a heart attack or diagnosis  of CAD by mid 54s).  He is being seen is being seen today for the evaluation to establish local cardiology care at the request of Wenda Low, MD. ?-> Recently moved to back [place], after having lived in [place] for 11 years.  ?-> In addition to his hypertension hyperlipidemia he has not significant joint issues having bilateral shoulder rotator cuff surgeries with tendon ruptures. ? ?Lucas Gentry was seen at [facility] by [name], MD-last visit was August 17, 2017 for a reassessment with persistent dizziness and potential syncope episode having passed out the bathroom floor (event occurred in March 2017).  Had a concussion profoundly orthostatic due to  dizziness and vomiting.  This was following intentional weight loss over 6 weeks.  He dehydrated.  Diuretics were stopped.  Echo was okay.  Myoview normal. ? ?-> After having moved to [facility], due to a change in insurance, he had to follow-up with [facility] (Dr. Jenetta Downer Bohle-initial consult was April 1921): Noted that his blood pressure and lipids are well controlled.  No cardiac symptoms.  Felt to have 10-year risk for MI of less than 3%.  Encouraged him to get back and exercise.  Medications continued.  No changes. ?Most recent visit was 01/25/2021: Noted that he was golfing and walking for his aerobic exercise.  The only interval change was that he had had repair of his right rotator cuff. => Labs from PCP still pending but no changes to medications made. ? ?Recent Hospitalizations:  ?- Most recently 11/13/2020:- L Rotator Cuff Repair - Shoulder Arthroscopy. ? ?Reviewed  CV studies:   ? ?The following studies were reviewed today: (if available, images/films reviewed: From Epic Chart or Care Everywhere) ?Coronary CAlcium Scoring-December 2015: Calcium score 0 ?08/17/2017: Again Coronary Calcium Score of 0 ?Myoview 01/10/2016: No ischemia or infarction.  EF 56%. ?30-day event monitor 01/05/2016: No arrhythmia. ?Echocardiogram 12/31/2015: (syncope) EF 55 to 60%.  No RWMA.  Normal diastolic function.  No wall motion normality.  Trivial AI, MR and TR.. ? ?Interval History:  ? ?Lucas Gentry presents here for his first visit with me.  He chose to switch from Dr. Johnsie Cancel to me because I just took care of his father having had a heart attack.  We got to know which other quite well while he was in the hospital.  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  He is actually now back in [place] working, and he and his wife are selling their house in [place].  They bought a house here  in [place] but he has been the last 6 weeks doing renovations before and after work.  He is basically biomechanical at both ends.  He is quite worn out today and thinks that maybe that is why his blood pressure is high today.  It usually is always in the 120s to 130s at home. ? ?His only real symptom that he noticed that he gets this neck stiffness at night that happens on occasion.  It gets tight and stiff going to back both shoulders.  His blood pressure will go up and that usually makes him feel kind of queasy.  This can last up to half an hour or so before sore throat resolved.  She has had bilateral rotator cuff surgeries with bicep tendon tears.. ? ?He has not yet gotten fully back into golfing but routinely plays pretty  competitive golf trying to walk as much as possible with it.  He does not really otherwise do definitive exercise.  However, he is still recovering from his surgery back in November, not yet back to his full golf.  Maybe a couple days a week but not as many days as he had been doing.  Also with the stress of moving. ? ?CV Review of Symptoms (Summary) ?Cardiovascular ROS: no chest pain or dyspnea on exertion ?negative for - edema, irregular heartbeat, orthopnea, palpitations, paroxysmal nocturnal dyspnea, rapid heart rate, shortness of breath, or syncope or near syncope, TIA/amaurosis fugax, claudication ? ?REVIEWED OF SYSTEMS  ? ?Review of Systems  ?Constitutional:  Negative for malaise/fatigue (Little deconditioned at this point, have not yet gotten into his routine of exercise.) and weight loss.  ?HENT:  Negative for congestion.   ?Respiratory:  Positive for cough (Chronic dry hacking). Negative for shortness of breath.   ?Cardiovascular:   ?     Per HPI  ?Gastrointestinal:  Negative for blood in stool and melena.  ?Genitourinary:  Negative for dysuria, flank pain and hematuria.  ?Musculoskeletal:  Positive for joint pain (Bilateral shoulders) and neck pain.  ?Neurological:  Negative for  dizziness and headaches.  ?Endo/Heme/Allergies:  Positive for environmental allergies (Take Zyrtec).  ?Psychiatric/Behavioral:  Negative for depression (Controlled with Zoloft.). The patient is nervous

## 2022-02-05 ENCOUNTER — Encounter: Payer: Self-pay | Admitting: Cardiology

## 2022-02-05 NOTE — Assessment & Plan Note (Signed)
No recurrent episodes.  He is very careful to try to maintain adequate hydration. ?

## 2022-02-05 NOTE — Assessment & Plan Note (Addendum)
Most recent LDL cholesterol 96.  Seems to tolerating atorvastatin 20 mg daily.  Most recent check was this month.  Even though he has significant family history is Coronary Calcium Score 0, therefore would not be more aggressive, especially with his musculoskeletal issues. ? ?He is pretty active and always on the go, but probably has not been as good as he should be with his dietary modification.  Discussed dietary adjustments. ? ?Continue to try to get aerobic exercise ?

## 2022-02-05 NOTE — Assessment & Plan Note (Signed)
He was changed from ACE inhibitor to ARB, but was initially valsartan, now he is on losartan.  Can reassess if cough continues and may be switch from losartan to a different ARB with less likelihood of having proximal activity with ACE inhibitors. ? ?Valsartan, irbesartan, telmisartan are olmesartan are all options.  All of them have combination options with HCTZ. ?

## 2022-02-05 NOTE — Assessment & Plan Note (Addendum)
Blood pressure is little higher today, but usually at home is much better.  He is actually on losartan-HCTZ 50/12.5 mg daily. ? ?8 years ago was changed from ACE inhibitor to ARB by Dr. [name] because of concerns for possible pulmonary complications.  He had a nagging cough back then.  Still having a nagging cough. => If this continues we can consider switching from losartan to a different ARB with less cross-reactivity to ACE inhibitors. ?

## 2022-02-05 NOTE — Assessment & Plan Note (Addendum)
Pretty significant family history of CAD on his mother side pretty much all the males, but now his father has had MI albeit at age 47.  He had outside where he does not want to be sure that he does not follow-up with the same category. ? ?He had a negative ETT back in 2015 and then the subsequently had Coronary Calcium Score's of 0 in 2015 and 2018.  Subsequently had a nonischemic Myoview in 2017 ? ?He denies any real chest pain symptoms just some right-sided symptoms related to his rotator cuff tear.  Is also had left-sided rotator cuff tear so he has this neck tightness that will sometimes make his blood pressure go up.  Has not had any true anginal symptoms. ? ?He still wants to make sure that he is stable, therefore we will recheck Coronary Calcium Score. ? ?We will also continue current dose of statin with his LDL being 96 on last check. ?

## 2022-03-16 ENCOUNTER — Ambulatory Visit
Admission: RE | Admit: 2022-03-16 | Discharge: 2022-03-16 | Disposition: A | Discharge status: Home / Self Care | Payer: Self-pay | Source: Ambulatory Visit | Attending: Cardiology | Admitting: Cardiology

## 2022-03-16 DIAGNOSIS — I10 Essential (primary) hypertension: Secondary | ICD-10-CM

## 2022-03-16 DIAGNOSIS — Z8249 Family history of ischemic heart disease and other diseases of the circulatory system: Secondary | ICD-10-CM

## 2022-03-16 DIAGNOSIS — E785 Hyperlipidemia, unspecified: Secondary | ICD-10-CM

## 2022-03-17 ENCOUNTER — Encounter: Payer: Self-pay | Admitting: Cardiology

## 2022-03-17 DIAGNOSIS — M25561 Pain in right knee: Secondary | ICD-10-CM | POA: Diagnosis not present

## 2022-03-19 ENCOUNTER — Encounter: Payer: Self-pay | Admitting: Cardiology

## 2022-03-19 NOTE — Progress Notes (Signed)
Primary Care Provider: Wenda Low, MD [facility] Cardiologist: Glenetta Hew, MD Electrophysiologist: None  Clinic Note: Chief Complaint  Patient presents with   Follow-up    Test results = Cor Calcium Score  No Sx   ===================================  ASSESSMENT/PLAN   Problem List Items Addressed This Visit       Cardiology Problems   Essential hypertension (Chronic)    He just took his blood pressure medication 15 minutes for arriving.  He says usually during the course of the day his blood pressures are stable.  He is still within relatively normal range of blood pressure but is upper limit of normal.  Continue to monitor, but would prefer to avoid titrating up at this point.      Relevant Medications   losartan-hydrochlorothiazide (HYZAAR) 50-12.5 MG tablet   Hyperlipidemia with target LDL less than 100 (Chronic)    Cholesterol from April looked pretty good.  LDL is less than 100 and total cholesterol 204.  Triglycerides are elevated at close to 300.  Coronary Calcium Score pretty much stable at 0.8.  We talked about adjusting his diet and increasing his exercise level.  Hopefully with some weight loss his triglycerides will come down as well as LDL.  Also suggested increasing to a total of 3 g a day fish oil.       Relevant Medications   losartan-hydrochlorothiazide (HYZAAR) 50-12.5 MG tablet     Other   Family history of early CAD - Primary (Chronic)    Significant family history on both sides but his father was 62 when he had his MI.  Based on his Coronary Calcium Score, he is likely at lower risk than would be dissipated.  However he does have hypertension, obesity and hypertriglyceridemia which qualifies for metabolic syndrome.  Continue to push risk factor modification with weight loss, glycemic and lipid control.  Increase exercise. => Target LDL < 100 (with goal < 70). BP control. Remain active - exercise       Metabolic syndrome (Chronic)     Hypertension, hypertriglyceridemia and obesity => diet exercise and weight loss all of these risk factors. Continue statin-add higher dose fish oil.  Continue ACE inhibitor-HCTZ. Weight loss      ===================================  HPI:    Lucas Gentry is a 46 y.o. male with a history of HTN, HLD, family history of CAD (father, age 75-recent MI along with "just about every male on his mother side" having a MI or CAD by mid 54s).  He is being seen is being seen today for close follow-up after initial evaluation to establish cardiology care at the request of Wenda Low, MD.  He had been followed by Dr. Johnsie Cancel prior to moving to [facility] several years ago.  He is now moving back to [place], but chose to transfer care to me because I became his father's cardiologist at the time of his MI -> I performed the Cath-PCI.  Lucas Gentry was seen on 02/01/2022 for initial consultation 2/2 Family Hx of CAD.  He noted being under quite a bit of stress "burning the candle at both ends "working full-time doing started his new work location back in [place] during normal business hours, but they are working late evenings and early mornings renovating his new house here in [place].  He and his wife recently purchased a house back in [place] and he has been doing less of the work himself refurbishing it. => No active cardiac symptoms.  Usually very active,  plays pretty competitive golf mostly bothered by repetitive shoulder injuries. Repeat Coronary Calcium Score ordered  Recent Hospitalizations:  N/a  Reviewed  CV studies:    The following studies were reviewed today: (if available, images/films reviewed: From Epic Chart or Care Everywhere) Coronary Calcium Score 03/16/2022: Score is 0.8. LOW RISK. Ao Root measured @ 4.2 mm.  No aneurysm  Interval History:   Lucas Gentry presents here for follow-up after his Coronary Calcium Score.  He is doing well.  Shoulder healing up okay but  his right knee is still giving him problems.  He is yet to get back to his routine exercise regimen.  He is about 20 pounds higher than when he usually weighs, and can feel it.  He is just a little bit lethargic.  He hopes to get back into doing some exercise once he rehabbed his knee. He still remains asymptomatic from cardiac standpoint.  We reviewed his coronary calcium score but the only abnormality being a small liver hemangioma that was stable from 2018.  Cardiovascular ROS: no chest pain or dyspnea on exertion negative for - edema, irregular heartbeat, orthopnea, palpitations, paroxysmal nocturnal dyspnea, rapid heart rate, shortness of breath, or syncope or near syncope, TIA/amaurosis fugax, claudication  REVIEWED OF SYSTEMS   Review of Systems  Constitutional:  Negative for malaise/fatigue (Little deconditioned at this point, have not yet gotten into his routine of exercise.) and weight loss.  HENT:  Negative for congestion.   Respiratory:  Positive for cough (Chronic dry hacking from allergies). Negative for shortness of breath.   Cardiovascular:        Per HPI  Gastrointestinal:  Negative for blood in stool and melena.  Genitourinary:  Negative for dysuria, flank pain and hematuria.  Musculoskeletal:  Positive for joint pain (Bilateral shoulders; right knee). Negative for neck pain.  Neurological:  Negative for dizziness and headaches.  Endo/Heme/Allergies:  Positive for environmental allergies (Take Zyrtec).  Psychiatric/Behavioral:  Negative for depression (Controlled with Zoloft.). The patient is nervous/anxious (Lots of stress; very well controlled with Zoloft but still has PRN Xanax that he uses every now and then.  Mostly for travel/flying.).    I have reviewed and (if needed) personally updated the patient's problem list, medications, allergies, past medical and surgical history, social and family history.   PAST MEDICAL HISTORY   Past Medical History:  Diagnosis Date    Family history of early CAD 09/21/2014   Maternal Uncle & GF as well as cousin. MI in 22s.  Father recent MI - 15; Coronary Ca++ Score (01/2022) = 0.8 -LOW RISK.    History of colon polyps    precancerous   Hyperlipidemia    takes Atorvastatin daily   Hypertension    takes Atenolol and Diovan daily   Seizures (HCC)    hx of-as a child.Once tonsils removed no other seizures  -> In addition to his hypertension hyperlipidemia he has not significant joint issues having bilateral shoulder rotator cuff surgeries with tendon ruptures.    Initial consult with Dr. Lazarus Gowda from [facility]  April 1921: Noted that his blood pressure and lipids are well controlled.  No cardiac symptoms.  Felt to have 10-year risk for MI of less than 3%.  Encouraged him to get back and exercise.  Medications continued.  No changes.  PAST SURGICAL HISTORY   Past Surgical History:  Procedure Laterality Date   30 Day Event Monitor  12/2015   No Arrhythmia   COLONOSCOPY  Coronary Calcium Score  08/2017   Calcium score of 0 (also noted in December 2015)   HERNIA REPAIR Right 10/16/2012   inguinal   LIPOMA EXCISION Right 12/10/2015   Procedure: EXCISION LIPOMA RIGHT UPPER THIGH;  Surgeon: Georganna Skeans, MD;  Location: [facility];  Service: General;  Laterality: Right;   NM MYOVIEW LTD  01/10/2016   No ischemia or infarction.  EF 56%.   SHOULDER ARTHROSCOPY WITH ROTATOR CUFF REPAIR Right 11/23/2020   Procedure: SHOULDER ARTHROSCOPY W/ ROTATOR CUFF REPAIR, BICEPS TENODESIS, EXTENSIVE DEBRIDEMENT; Surgeon: Martinique Miller Case, MD; Location: [place]; Service: Orthopedics; Laterality: Right;   SHOULDER ARTHROSCOPY WITH ROTATOR CUFF REPAIR AND OPEN BICEPS TENODESIS Left 11/13/2020   At [place] ECHOCARDIOGRAM  12/31/2015   (syncope) EF 55 to 60%.  No RWMA.  Normal diastolic function.  No wall motion normality.  Trivial AI, MR and TR..    Immunization  History  Administered Date(s) Administered   Influenza Split 07/16/2014   Janssen (J&J) SARS-COV-2 Vaccination 12/27/2019    MEDICATIONS/ALLERGIES   Current Meds  Medication Sig   ALPRAZolam (XANAX) 0.5 MG tablet Take 0.5 mg by mouth daily as needed for anxiety (flying).   aspirin 81 MG tablet Take 81 mg by mouth daily.   atorvastatin (LIPITOR) 10 MG tablet Take 20 mg by mouth daily. TOTAL 20 MG   azelastine (ASTELIN) 0.1 % nasal spray Place into the nose.   CALCIUM PO Take 4 tablets by mouth daily.    escitalopram (LEXAPRO) 10 MG tablet Take 10 mg by mouth daily.   loratadine (CLARITIN) 10 MG tablet Take 10 mg by mouth daily.   losartan-hydrochlorothiazide (HYZAAR) 50-12.5 MG tablet Take 1 tablet by mouth daily.   Multiple Vitamins-Minerals (MULTIVITAL PO) Take 1 tablet by mouth daily.   Omega-3 Fatty Acids (FISH OIL PO) Take 1 capsule by mouth daily.   OVER THE COUNTER MEDICATION Take 1 tablet by mouth daily. Carcumin    Allergies  Allergen Reactions   Adhesive [Tape] Rash and Other (See Comments)    32M bandaids glues makes blisters    SOCIAL HISTORY/FAMILY HISTORY   Reviewed in Epic:   Social History   Tobacco Use   Smoking status: Never   Smokeless tobacco: Never   Tobacco comments:    cigar once a year   Substance Use Topics   Alcohol use: Yes    Alcohol/week: 2.0 standard drinks of alcohol    Types: 2 Standard drinks or equivalent per week    Comment: occasionaly   Drug use: No   Social History   Social History Narrative   Born and raised in [place].   Married with 2 kids aged 67 and 73 (April 2023)   Works in [employer] fundraiser, owns a business, recently relocated back to [place].      In the process of selling their house in [place] and renovating his new house in [place].  Has spent the last 6 weeks basically refurbishing the entire house with exception of the kitchen and bathroom that require more expert skills.      Enjoys playing golf  quite competitively.  Usually plays several days a week, but is now recovering from recent shoulder surgery.  He kept on playing after his injury and made it worse.  He now was told that he needs to take it easy to let himself recover.   Family History  Problem Relation Age of Onset   Hypertension  Mother    Hyperlipidemia Mother    Hyperlipidemia Father    Heart attack Father 10   Hypertension Maternal Grandmother    Heart attack Maternal Grandfather 6   Hyperlipidemia Paternal Grandmother    Cancer Paternal Grandmother 48       ? type   Hyperlipidemia Paternal Grandfather    Stroke Paternal Grandfather    Heart attack Maternal Uncle 2   Heart attack Cousin 45       Maternal cousin    OBJCTIVE -PE, EKG, labs   Wt Readings from Last 3 Encounters:  03/20/22 235 lb 3.2 oz (106.7 kg)  02/01/22 239 lb 12.8 oz (108.8 kg)  09/05/19 235 lb 12.8 oz (107 kg)    Physical Exam: BP 130/80   Pulse 88   Ht '6\' 1"'$  (1.854 m)   Wt 235 lb 3.2 oz (106.7 kg)   SpO2 98%   BMI 31.03 kg/m  Physical Exam Vitals reviewed.  Constitutional:      General: He is not in acute distress.    Appearance: Normal appearance. He is obese. He is not ill-appearing (Well-nourished, well-groomed.) or toxic-appearing.  HENT:     Head: Normocephalic and atraumatic.  Neck:     Vascular: No carotid bruit or JVD.  Cardiovascular:     Rate and Rhythm: Normal rate and regular rhythm. Occasional Extrasystoles are present.    Chest Wall: PMI is not displaced.     Heart sounds: S1 normal and S2 normal. Heart sounds are distant.     No friction rub.  Pulmonary:     Effort: Pulmonary effort is normal.     Breath sounds: No wheezing.  Musculoskeletal:        General: No swelling. Normal range of motion.     Cervical back: Normal range of motion and neck supple.  Skin:    General: Skin is warm and dry.     Coloration: Skin is not jaundiced.  Neurological:     General: No focal deficit present.     Mental  Status: He is alert and oriented to person, place, and time.     Cranial Nerves: No cranial nerve deficit.     Gait: Gait normal.  Psychiatric:        Mood and Affect: Mood normal.        Behavior: Behavior normal.        Thought Content: Thought content normal.        Judgment: Judgment normal.     Adult ECG Report  N/A  Recent Labs: Reviewed no new labs since last visit. 01/17/2022: TC 204, TG 326, HDL 59, LDL 96.  Hgb 14.7, Cr 0.97,K+ 4.2 No results found for: "CHOL", "HDL", "LDLCALC", "LDLDIRECT", "TRIG", "CHOLHDL"  ==================================================  COVID-19 Education: The signs and symptoms of COVID-19 were discussed with the patient and how to seek care for testing (follow up with PCP or arrange E-visit).    I spent a total of 19 minutes with the patient spent in direct patient consultation.  Additional time spent with chart review  / charting (studies, outside notes, etc): 8  min Total Time: 27 min  Current medicines are reviewed at length with the patient today.  (+/- concerns) none  Notice: This dictation was prepared with Dragon dictation along with smart phrase technology. Any transcriptional errors that result from this process are unintentional and may not be corrected upon review.   Studies Ordered:  No orders of the defined types were placed in this encounter.  No orders of the defined types were placed in this encounter.   Patient Instructions / Medication Changes & Studies & Tests Ordered   Patient Instructions  Medication Instructions:    No changes except you can increase Fish oil to 3,000 gm a day  to help with your cholesterol   *If you need a refill on your cardiac medications before your next appointment, please call your pharmacy*   Lab Work:  Not needed   Testing/Procedures:  Not needed  Follow-Up: At [facility], you and your health needs are our priority.  As part of our continuing mission to provide you with  exceptional heart care, we have created designated Provider Care Teams.  These Care Teams include your primary Cardiologist (physician) and Advanced Practice Providers (APPs -  Physician Assistants and Nurse Practitioners) who all work together to provide you with the care you need, when you need it.     Your next appointment:   12 month(s)  The format for your next appointment:   In Person  Provider:   Glenetta Hew, MD        Glenetta Hew, M.D., M.S. Interventional Cardiologist   Pager # [phone] Phone # [phone] [address] [place], [place] [address]   Thank you for choosing Heartcare at [facility]!!

## 2022-03-20 ENCOUNTER — Encounter: Payer: Self-pay | Admitting: Cardiology

## 2022-03-20 ENCOUNTER — Ambulatory Visit (INDEPENDENT_AMBULATORY_CARE_PROVIDER_SITE_OTHER): Payer: 59 | Admitting: Cardiology

## 2022-03-20 VITALS — BP 130/80 | HR 88 | Ht 73.0 in | Wt 235.2 lb

## 2022-03-20 DIAGNOSIS — E8881 Metabolic syndrome: Secondary | ICD-10-CM

## 2022-03-20 DIAGNOSIS — E785 Hyperlipidemia, unspecified: Secondary | ICD-10-CM

## 2022-03-20 DIAGNOSIS — Z8249 Family history of ischemic heart disease and other diseases of the circulatory system: Secondary | ICD-10-CM | POA: Diagnosis not present

## 2022-03-20 DIAGNOSIS — I10 Essential (primary) hypertension: Secondary | ICD-10-CM

## 2022-03-20 NOTE — Patient Instructions (Addendum)
Medication Instructions:    No changes except you can increase Fish oil to 3,000 gm a day  to help with your cholesterol   *If you need a refill on your cardiac medications before your next appointment, please call your pharmacy*   Lab Work:  Not needed   Testing/Procedures:  Not needed  Follow-Up: At [facility], you and your health needs are our priority.  As part of our continuing mission to provide you with exceptional heart care, we have created designated Provider Care Teams.  These Care Teams include your primary Cardiologist (physician) and Advanced Practice Providers (APPs -  Physician Assistants and Nurse Practitioners) who all work together to provide you with the care you need, when you need it.     Your next appointment:   12 month(s)  The format for your next appointment:   In Person  Provider:   Glenetta Hew, MD

## 2022-03-20 NOTE — Assessment & Plan Note (Addendum)
Significant family history on both sides but his father was 81 when he had his MI.  Based on his Coronary Calcium Score, he is likely at lower risk than would be dissipated.  However he does have hypertension, obesity and hypertriglyceridemia which qualifies for metabolic syndrome.  Continue to push risk factor modification with weight loss, glycemic and lipid control.  Increase exercise. => Target LDL < 100 (with goal < 70). BP control. Remain active - exercise

## 2022-03-20 NOTE — Assessment & Plan Note (Signed)
Cholesterol from April looked pretty good.  LDL is less than 100 and total cholesterol 204.  Triglycerides are elevated at close to 300.  Coronary Calcium Score pretty much stable at 0.8.  We talked about adjusting his diet and increasing his exercise level.  Hopefully with some weight loss his triglycerides will come down as well as LDL.  Also suggested increasing to a total of 3 g a day fish oil.

## 2022-03-20 NOTE — Assessment & Plan Note (Signed)
Hypertension, hypertriglyceridemia and obesity => diet exercise and weight loss all of these risk factors. Continue statin-add higher dose fish oil.  Continue ACE inhibitor-HCTZ. Weight loss

## 2022-03-20 NOTE — Assessment & Plan Note (Signed)
He just took his blood pressure medication 15 minutes for arriving.  He says usually during the course of the day his blood pressures are stable.  He is still within relatively normal range of blood pressure but is upper limit of normal.  Continue to monitor, but would prefer to avoid titrating up at this point.

## 2022-04-02 DIAGNOSIS — S83241A Other tear of medial meniscus, current injury, right knee, initial encounter: Secondary | ICD-10-CM | POA: Diagnosis not present

## 2022-04-03 DIAGNOSIS — S83231D Complex tear of medial meniscus, current injury, right knee, subsequent encounter: Secondary | ICD-10-CM | POA: Diagnosis not present

## 2022-04-22 ENCOUNTER — Encounter: Payer: Self-pay | Admitting: Cardiology

## 2022-04-27 DIAGNOSIS — M67361 Transient synovitis, right knee: Secondary | ICD-10-CM | POA: Diagnosis not present

## 2022-04-27 DIAGNOSIS — I9789 Other postprocedural complications and disorders of the circulatory system, not elsewhere classified: Secondary | ICD-10-CM | POA: Diagnosis not present

## 2022-04-27 DIAGNOSIS — M25561 Pain in right knee: Secondary | ICD-10-CM | POA: Diagnosis not present

## 2022-04-27 DIAGNOSIS — G8918 Other acute postprocedural pain: Secondary | ICD-10-CM | POA: Diagnosis not present

## 2022-04-27 DIAGNOSIS — X58XXXA Exposure to other specified factors, initial encounter: Secondary | ICD-10-CM | POA: Diagnosis not present

## 2022-04-27 DIAGNOSIS — M6588 Other synovitis and tenosynovitis, other site: Secondary | ICD-10-CM | POA: Diagnosis not present

## 2022-04-27 DIAGNOSIS — M65861 Other synovitis and tenosynovitis, right lower leg: Secondary | ICD-10-CM | POA: Diagnosis not present

## 2022-04-27 DIAGNOSIS — S83231A Complex tear of medial meniscus, current injury, right knee, initial encounter: Secondary | ICD-10-CM | POA: Diagnosis not present

## 2022-04-27 DIAGNOSIS — M2391 Unspecified internal derangement of right knee: Secondary | ICD-10-CM | POA: Diagnosis not present

## 2022-04-27 DIAGNOSIS — M94261 Chondromalacia, right knee: Secondary | ICD-10-CM | POA: Diagnosis not present

## 2022-04-27 DIAGNOSIS — Z4789 Encounter for other orthopedic aftercare: Secondary | ICD-10-CM | POA: Diagnosis not present

## 2022-04-27 DIAGNOSIS — M2410 Other articular cartilage disorders, unspecified site: Secondary | ICD-10-CM | POA: Diagnosis not present

## 2022-04-27 DIAGNOSIS — S83241A Other tear of medial meniscus, current injury, right knee, initial encounter: Secondary | ICD-10-CM | POA: Diagnosis not present

## 2022-05-29 DIAGNOSIS — E782 Mixed hyperlipidemia: Secondary | ICD-10-CM | POA: Diagnosis not present

## 2022-06-07 DIAGNOSIS — M25562 Pain in left knee: Secondary | ICD-10-CM | POA: Diagnosis not present

## 2022-07-31 DIAGNOSIS — R21 Rash and other nonspecific skin eruption: Secondary | ICD-10-CM | POA: Diagnosis not present

## 2022-08-08 DIAGNOSIS — R21 Rash and other nonspecific skin eruption: Secondary | ICD-10-CM | POA: Diagnosis not present

## 2022-08-11 IMAGING — CT CT CARDIAC CORONARY ARTERY CALCIUM SCORE
1 series · 2 of 2 positions shown, 3 images · non-contrast
Comparison: None Available.
COMPARISON: None Available.

Addendum:
EXAM:
OVER-READ INTERPRETATION  CT CHEST

The following report is a limited chest CT over-read performed by
03/16/2022. The coronary calcium score interpretation by the
cardiologist is attached.
CLINICAL DATA: Cardiovascular Disease Risk stratification
Coronary Calcium Score
TECHNIQUE: A gated, non-contrast computed tomography scan of the heart was
performed using 3mm slice thickness. Axial images were analyzed on a
dedicated workstation. Calcium scoring of the coronary arteries was
performed using the Agatston method.

[Series 750: findings · 0.47mm/px · 2 of 2 slices shown, 3 images]
[im 1/2  vessel]
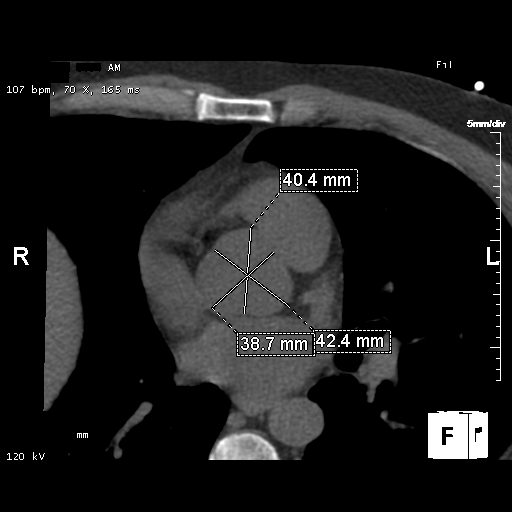
[im 1/2  lung]
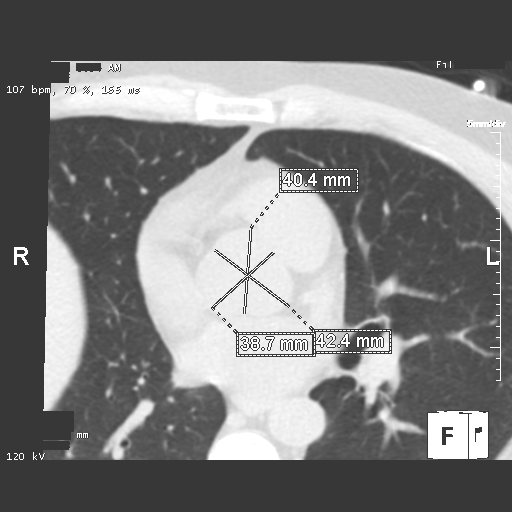
[im 2/2  vessel]
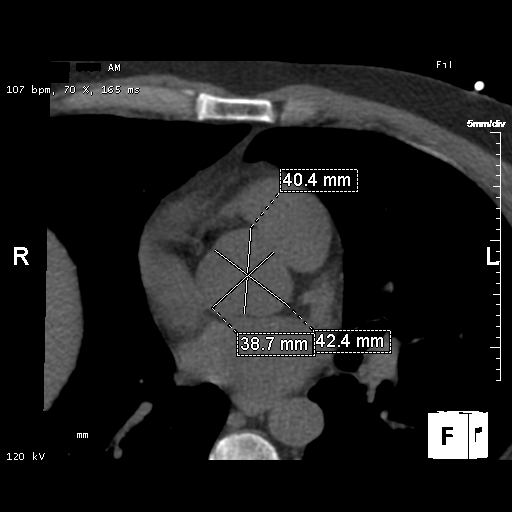

[2 of 2 positions shown; findings below may reference images not displayed]

FINDINGS: Within the visualized portions of the thorax there are no suspicious
appearing pulmonary nodules or masses, there is no acute
consolidative airspace disease, no pleural effusions, no
pneumothorax and no lymphadenopathy. Visualized portions of the
upper abdomen demonstrates a poorly defined intermediate attenuation
lesion in segment 7 of the liver (axial image 80 of series 3),
incompletely characterized, but previously characterized as a
cavernous hemangioma on prior CT the abdomen 08/20/2017. There are
no aggressive appearing lytic or blastic lesions noted in the
visualized portions of the skeleton.
IMPRESSION: 1. No significant incidental noncardiac findings are noted.
FINDINGS: Coronary Calcium Score:

Left main: 0

Left anterior descending artery:

Left circumflex artery: 0

Right coronary artery: 0

Total:

Percentile: 72nd

Pericardium: Normal.

Ascending aorta: 35 mm

Aortic Root: Aneurysm up to 42 mm.

Non-cardiac: See separate report from [REDACTED].
IMPRESSION: 1. Coronary calcium score of 0.8. This was 72nd percentile for age-,
race-, and sex-matched controls.

2. Aortic root aneurysm up to 42 mm.



If CAC=0, it is reasonable to withhold statin therapy and reassess
in 5 to 10 years, as long as higher risk conditions are absent
(diabetes mellitus, family history of premature CHD in first degree
relatives (males <55 years; females <65 years), cigarette smoking,
or LDL >=190 mg/dL).

If CAC is 1 to 99, it is reasonable to initiate statin therapy for
patients >=55 years of age.

If CAC is >=100 or >=75th percentile, it is reasonable to initiate
statin therapy at any age.

Cardiology referral should be considered for patients with CAC
scores >=400 or >=75th percentile.

*0985 AHA/ACC/AACVPR/AAPA/ABC/CHMS/MEI/JOLICOEUR/Tiger/MAST/GORIVA/CONSUELA
Guideline on the Management of Blood Cholesterol: A Report of the
American College of Cardiology/American Heart Association Task Force
on Clinical Practice Guidelines. J Am Coll Cardiol.
1251;73(24):3407-3780.

*** End of Addendum ***
EXAM:
OVER-READ INTERPRETATION  CT CHEST

The following report is a limited chest CT over-read performed by
03/16/2022. The coronary calcium score interpretation by the
cardiologist is attached.
FINDINGS: Within the visualized portions of the thorax there are no suspicious
appearing pulmonary nodules or masses, there is no acute
consolidative airspace disease, no pleural effusions, no
pneumothorax and no lymphadenopathy. Visualized portions of the
upper abdomen demonstrates a poorly defined intermediate attenuation
lesion in segment 7 of the liver (axial image 80 of series 3),
incompletely characterized, but previously characterized as a
cavernous hemangioma on prior CT the abdomen 08/20/2017. There are
no aggressive appearing lytic or blastic lesions noted in the
visualized portions of the skeleton.
IMPRESSION: 1. No significant incidental noncardiac findings are noted.

## 2022-08-15 DIAGNOSIS — M722 Plantar fascial fibromatosis: Secondary | ICD-10-CM | POA: Diagnosis not present

## 2022-08-24 DIAGNOSIS — M25562 Pain in left knee: Secondary | ICD-10-CM | POA: Diagnosis not present

## 2022-08-24 DIAGNOSIS — M948X6 Other specified disorders of cartilage, lower leg: Secondary | ICD-10-CM | POA: Diagnosis not present

## 2022-08-28 DIAGNOSIS — M25562 Pain in left knee: Secondary | ICD-10-CM | POA: Diagnosis not present

## 2022-09-19 DIAGNOSIS — R0981 Nasal congestion: Secondary | ICD-10-CM | POA: Diagnosis not present

## 2022-09-19 DIAGNOSIS — E782 Mixed hyperlipidemia: Secondary | ICD-10-CM | POA: Diagnosis not present

## 2022-09-19 DIAGNOSIS — I10 Essential (primary) hypertension: Secondary | ICD-10-CM | POA: Diagnosis not present

## 2022-09-19 DIAGNOSIS — R69 Illness, unspecified: Secondary | ICD-10-CM | POA: Diagnosis not present

## 2023-01-18 DIAGNOSIS — E782 Mixed hyperlipidemia: Secondary | ICD-10-CM | POA: Diagnosis not present

## 2023-01-18 DIAGNOSIS — Z125 Encounter for screening for malignant neoplasm of prostate: Secondary | ICD-10-CM | POA: Diagnosis not present

## 2023-01-18 DIAGNOSIS — I10 Essential (primary) hypertension: Secondary | ICD-10-CM | POA: Diagnosis not present

## 2023-02-17 DIAGNOSIS — R079 Chest pain, unspecified: Secondary | ICD-10-CM | POA: Diagnosis not present

## 2023-06-11 DIAGNOSIS — D124 Benign neoplasm of descending colon: Secondary | ICD-10-CM | POA: Diagnosis not present

## 2023-06-11 DIAGNOSIS — Z86010 Personal history of colonic polyps: Secondary | ICD-10-CM | POA: Diagnosis not present

## 2023-06-11 DIAGNOSIS — Z09 Encounter for follow-up examination after completed treatment for conditions other than malignant neoplasm: Secondary | ICD-10-CM | POA: Diagnosis not present

## 2023-08-20 ENCOUNTER — Encounter: Payer: Self-pay | Admitting: Cardiology

## 2023-08-20 DIAGNOSIS — D126 Benign neoplasm of colon, unspecified: Secondary | ICD-10-CM | POA: Diagnosis not present

## 2023-08-20 DIAGNOSIS — I10 Essential (primary) hypertension: Secondary | ICD-10-CM | POA: Diagnosis not present

## 2023-08-20 DIAGNOSIS — F419 Anxiety disorder, unspecified: Secondary | ICD-10-CM | POA: Diagnosis not present

## 2023-08-20 DIAGNOSIS — E782 Mixed hyperlipidemia: Secondary | ICD-10-CM | POA: Diagnosis not present

## 2023-08-24 ENCOUNTER — Ambulatory Visit: Payer: 59 | Admitting: Cardiology

## 2023-10-24 ENCOUNTER — Ambulatory Visit: Payer: 59 | Attending: Cardiology | Admitting: Cardiology

## 2023-10-24 VITALS — BP 120/88 | HR 82 | Ht 73.0 in | Wt 258.0 lb

## 2023-10-24 DIAGNOSIS — E785 Hyperlipidemia, unspecified: Secondary | ICD-10-CM

## 2023-10-24 DIAGNOSIS — E8881 Metabolic syndrome: Secondary | ICD-10-CM

## 2023-10-24 DIAGNOSIS — I10 Essential (primary) hypertension: Secondary | ICD-10-CM | POA: Diagnosis not present

## 2023-10-24 DIAGNOSIS — R635 Abnormal weight gain: Secondary | ICD-10-CM | POA: Diagnosis not present

## 2023-10-24 DIAGNOSIS — Z8249 Family history of ischemic heart disease and other diseases of the circulatory system: Secondary | ICD-10-CM

## 2023-10-24 NOTE — Progress Notes (Signed)
 Cardiology Office Note:  .   Date:  10/29/2023  ID:  Lucas Gentry, DOB 29-Aug-1976, MRN [id number] PCP: Ransom Other, MD  [facility] Providers Cardiologist:  Lucas Clay, MD     Chief Complaint  Patient presents with   Follow-up    Stable.  Doing well.    Patient Profile: .     Lucas CHRISTELLA Egelhoff is a mildly obese 48 y.o. male  with a PMH notable for HTN, HLD and family history of CAD (father had an MI at age 51, and apparently almost every male on his mother side of the family had early MI or CAD) who presents here for delayed follow-up (6-month) at the request of [name], [name], MD.   He had been followed by Dr. Delford prior to moving to [facility] several years ago.  He is now moving back to [place], but chose to transfer care to me because I became his father's cardiologist at the time of his MI -> I performed the Cath-PCI.  When he and his wife moved back to [place], he has to be reestablished with a cardiologist since I just become his father's cardiologist.     Lucas CHRISTELLA Rosekrans was last seen on March 20, 2022 to follow-up his Coronary Calcium  Score (score 0.8).  The major issues that he had been dealing with shoulder surgery and knee pain.  He hurt his shoulder playing golf.  The only abnormality [place] Coronary Calcium  Score was a liver hemangioma stable from 2018.  Subjective  Discussed the use of AI scribe software for clinical note transcription with the patient, who gave verbal consent to proceed.  History of Present Illness   The patient, a 48 year old with a history of hyperlipidemia, hypertension, and obesity, presents for a routine follow-up. He reports no new health issues, denying any chest pain, shortness of breath, or dizziness. He acknowledges recent weight gain, attributing it to decreased physical activity due to orthopedic issues and increased food intake. He underwent surgeries on the right knee and both shoulders within the past year, which  have since improved.  The patient's current medications include Atorvastatin  20mg , Hyzaar 50-12.5 mg, and fish oil capsules, which he takes twice daily. He reports compliance with his medication regimen. The patient's last labs were in April of the previous year, with an LDL of 100 and triglycerides of 252. He is due for a follow-up with his primary care physician in April for a physical and lab work.  The patient's lifestyle includes playing golf, which is his main form of exercise. He recently purchased a beach house, where he plans to spend vacations. Despite his medical conditions, the patient maintains an active lifestyle and appears to be managing his health well.      Cardiovascular ROS: no chest pain or dyspnea on exertion positive for - recent weight gain negative for - edema, irregular heartbeat, orthopnea, palpitations, paroxysmal nocturnal dyspnea, rapid heart rate, shortness of breath, or syncope or near syncope, TIA or amaurosis fugax, claudication.  ROS:  Review of Systems - Negative except symptoms noted in HPI and below -Allergies with chronic cough, shoulder and knee pain -Well-controlled anxiety/depression    Objective   Studies Reviewed: SABRA   EKG Interpretation Date/Time:  Wednesday October 24 2023 10:45:46 EST Ventricular Rate:  82 PR Interval:  154 QRS Duration:  90 QT Interval:  384 QTC Calculation: 448 R Axis:   36  Text Interpretation: Normal sinus rhythm Normal ECG When compared with ECG of 30-Dec-2015 09:13,  T wave inversion NO LONGER PRESENT Otherwise no significant change Confirmed by Anner Lenis ([id number]) on 10/28/2023 11:54:28 PM      No results found for: CHOL, HDL, LDLCALC, LDLDIRECT, TRIG, CHOLHDL   LABS LDL: 100 (01/2023) Total blood sugar: 193 (01/2023) HDL: 51 (01/2023) Triglycerides: 252 (01/2023)  Risk Assessment/Calculations:         Physical Exam:   VS:  BP 120/88 (BP Location: Left Arm, Patient Position: Sitting, Cuff  Size: Large)   Pulse 82   Ht 6' 1 (1.854 m)   Wt 258 lb (117 kg)   SpO2 95%   BMI 34.04 kg/m    Wt Readings from Last 3 Encounters:  10/24/23 258 lb (117 kg)  03/20/22 235 lb 3.2 oz (106.7 kg)  02/01/22 239 lb 12.8 oz (108.8 kg)    GEN: Healthy.  Well nourished, well developed in no acute distress; obese, well-groomed NECK: No JVD; No carotid bruits CARDIAC: Normal S1, S2; RRR, no murmurs, rubs, gallops RESPIRATORY:  Clear to auscultation without rales, wheezing or rhonchi ; nonlabored, good air movement. ABDOMEN: Soft, non-tender, non-distended EXTREMITIES:  No edema; No deformity     ASSESSMENT AND PLAN: .    Problem List Items Addressed This Visit       Cardiology Problems   Essential hypertension - Primary (Chronic)   Blood pressure stable on current dose of Hyzaar 50-12.5 mg daily -Continue current medication      Relevant Orders   EKG 12-Lead (Completed)   Hyperlipidemia with target LDL less than 100 (Chronic)   LDL of 100 in April 2024, despite Atorvastatin  20mg  and fish oil supplementation. Weight gain noted. -Recommendation to PCP to consider increasing Atorvastatin  to 40mg  or switching to Rosuvastatin 20mg  if LDL remains elevated at next check in April 2025. -Continue fish oil supplementation, 3 capsules daily.        Other   Family history of early CAD (Chronic)   Significant family history on both sides.  However Relatively Low Risk Coronary Calcium  Score.  Target LDL is less than 100, along with blood pressure and glycemic control.  Also recommend weight loss with increased exercise, and dietary modifications.      Metabolic syndrome (Chronic)   This puts him at increased risk for CVD somewhat tempered by low Coronary Calcium  Score result. Blood pressure well-controlled, targeting LDL less than 100 along with glycemic control.  Also discussed weight loss.      Weight gain (Chronic)   Acknowledged weight gain and difficulty losing weight. No associated  symptoms of dyspnea or chest pain. -Encouraged lifestyle modifications including diet and exercise.        Follow-Up: Return in about 17 months (around 03/23/2025).Plan to return in May/June 2026 after annual physical with PCP in April.      Signed, Lenis MICAEL Anner, MD, MS Lenis Anner, M.D., M.S. Interventional Cardiologist  [facility]  Pager # [phone] Phone # [phone] [address] [place], [place] [address]

## 2023-10-24 NOTE — Patient Instructions (Signed)
 Medication Instructions:   No changes  *If you need a refill on your cardiac medications before your next appointment, please call your pharmacy*   Lab Work:  Not needed.   Testing/Procedures:  Not needed  Follow-Up: At [facility], you and your health needs are our priority.  As part of our continuing mission to provide you with exceptional heart care, we have created designated Provider Care Teams.  These Care Teams include your primary Cardiologist (physician) and Advanced Practice Providers (APPs -  Physician Assistants and Nurse Practitioners) who all work together to provide you with the care you need, when you need it.     Your next appointment:   16 to 17  month(s) ( May or June 2026)   The format for your next appointment:   In Person  Provider:   [name] Clay, MD    Other Instructions   Information for your wife KardiaMobile [email or web address]        FDA-cleared, clinical grade mobile EKG monitor: Crist is the most clinically-validated mobile EKG used by the world's leading cardiac care medical professionals With Basic service, know instantly if your heart rhythm is normal or if atrial fibrillation is detected, and email the last single EKG recording to yourself or your doctor Premium service, available for purchase through the Kardia app for $9.99 per month or $99 per year, includes unlimited history and storage of your EKG recordings, a monthly EKG summary report to share with your doctor, along with the ability to track your blood pressure, activity and weight Includes one KardiaMobile phone clip FREE SHIPPING: Standard delivery 1-3 business days. Orders placed by 11:00am PST will ship that afternoon. Otherwise, will ship next business day. All orders ship via [employer] from [place], [place]    [employer] - sending an EKG Download app and set up profile. Run EKG - by placing 1-2 fingers on the silver plates After  EKG is complete - Download PDF  - Skip password (if you apply a password the provider will need it to view the EKG) Click share button (square with upward arrow) in bottom left corner To send: choose MyChart (first time log into MyChart)  Pop up window about sending ECG Click continue Choose type of message Choose provider Type subject and message Click send (EKG should be attached)  - To send additional EKGs in one message click the paperclip image and bottom of page to attach.

## 2023-10-28 ENCOUNTER — Encounter: Payer: Self-pay | Admitting: Cardiology

## 2023-10-29 DIAGNOSIS — R635 Abnormal weight gain: Secondary | ICD-10-CM | POA: Insufficient documentation

## 2023-10-29 NOTE — Assessment & Plan Note (Addendum)
 This puts him at increased risk for CVD somewhat tempered by low Coronary Calcium Score result. Blood pressure well-controlled, targeting LDL less than 100 along with glycemic control.  Also discussed weight loss.

## 2023-10-29 NOTE — Assessment & Plan Note (Addendum)
 LDL of 100 in April 2024, despite Atorvastatin  20mg  and fish oil supplementation. Weight gain noted. -Recommendation to PCP to consider increasing Atorvastatin  to 40mg  or switching to Rosuvastatin 20mg  if LDL remains elevated at next check in April 2025. -Continue fish oil supplementation, 3 capsules daily.

## 2023-10-29 NOTE — Assessment & Plan Note (Signed)
 Blood pressure stable on current dose of Hyzaar 50-12.5 mg daily -Continue current medication

## 2023-10-29 NOTE — Assessment & Plan Note (Signed)
 Significant family history on both sides.  However Relatively Low Risk Coronary Calcium Score.  Target LDL is less than 100, along with blood pressure and glycemic control.  Also recommend weight loss with increased exercise, and dietary modifications.

## 2023-10-29 NOTE — Assessment & Plan Note (Signed)
 Acknowledged weight gain and difficulty losing weight. No associated symptoms of dyspnea or chest pain. -Encouraged lifestyle modifications including diet and exercise.

## 2024-05-28 DIAGNOSIS — M65341 Trigger finger, right ring finger: Secondary | ICD-10-CM | POA: Diagnosis not present

## 2024-07-01 ENCOUNTER — Ambulatory Visit: Attending: Internal Medicine | Admitting: Audiologist

## 2024-07-01 DIAGNOSIS — H9193 Unspecified hearing loss, bilateral: Secondary | ICD-10-CM | POA: Diagnosis not present

## 2024-07-01 NOTE — Procedures (Signed)
  Outpatient Audiology and [facility] [address] [place], [place]  [address] [phone]  AUDIOLOGICAL  EVALUATION  NAME: Lucas Gentry     DOB:   11-Oct-1976      MRN: [id number]                                                                                     DATE: 07/01/2024     REFERENT: Ransom Other, MD STATUS: Outpatient DIAGNOSIS: Decreased Hearing   History: [name] was seen for an audiological evaluation due to his wife's concern that he is not hearing well. His spouse and children tell him he talks loud. He feels he hears well.  [name] denies pain, pressure, or tinnitus.  [name] no significant history of hazardous noise exposure.  Medical history shows no additional risk for hearing loss.    Evaluation:  Otoscopy showed a clear view of the tympanic membranes, bilaterally Tympanometry results were consistent with normal middle ear function, bilaterally   Audiometric testing was completed using Conventional Audiometry techniques with insert earphones and supraural headphones. Test results are consistent with normal hearing 250-8kHz bilaterally. Speech Recognition Thresholds were obtained at 10dB HL in the right ear and at 15dB HL in the left ear. Word Recognition Testing was completed at  40dB SL and [name] scored 100% in each ear.  Quick Speech in Noise Test (QuickSIN):  list of six sentences with five key words per sentence is presented in four-talker babble noise. [name] scored in mild loss range, showing mild difficulty in the presence of noise.     Results:  The test results were reviewed with [name]. He has normal hearing. He has some mild difficulty hearing in noise. No need for concern or follow up.  Audiogram printed and provided to [employer]   Recommendations: Hearing normal. No follow up needed.   21 minutes spent testing and counseling on results.   If you have any questions please feel free to contact me at [phone].  Lauraine Ka Stalnaker  Au.D.  Audiologist   07/01/2024  9:06 AM  Cc: [name], [name], MD
